# Patient Record
Sex: Male | Born: 1955
Health system: Southern US, Community
[De-identification: ages and names within clinical notes are randomized; demographics above are authoritative.]

## PROBLEM LIST (undated history)

## (undated) DIAGNOSIS — I10 Essential (primary) hypertension: Secondary | ICD-10-CM

## (undated) DIAGNOSIS — R972 Elevated prostate specific antigen [PSA]: Secondary | ICD-10-CM

## (undated) DIAGNOSIS — M1990 Unspecified osteoarthritis, unspecified site: Secondary | ICD-10-CM

## (undated) DIAGNOSIS — M5136 Other intervertebral disc degeneration, lumbar region: Secondary | ICD-10-CM

## (undated) DIAGNOSIS — T7840XA Allergy, unspecified, initial encounter: Secondary | ICD-10-CM

## (undated) DIAGNOSIS — M51369 Other intervertebral disc degeneration, lumbar region without mention of lumbar back pain or lower extremity pain: Secondary | ICD-10-CM

## (undated) DIAGNOSIS — N529 Male erectile dysfunction, unspecified: Secondary | ICD-10-CM

## (undated) DIAGNOSIS — H35039 Hypertensive retinopathy, unspecified eye: Secondary | ICD-10-CM

## (undated) DIAGNOSIS — E785 Hyperlipidemia, unspecified: Secondary | ICD-10-CM

## (undated) DIAGNOSIS — K649 Unspecified hemorrhoids: Secondary | ICD-10-CM

## (undated) DIAGNOSIS — J45909 Unspecified asthma, uncomplicated: Secondary | ICD-10-CM

## (undated) DIAGNOSIS — E7800 Pure hypercholesterolemia, unspecified: Secondary | ICD-10-CM

## (undated) DIAGNOSIS — J309 Allergic rhinitis, unspecified: Secondary | ICD-10-CM

## (undated) DIAGNOSIS — N400 Enlarged prostate without lower urinary tract symptoms: Secondary | ICD-10-CM

## (undated) HISTORY — DX: Elevated prostate specific antigen (PSA): R97.20

## (undated) HISTORY — DX: Hypertensive retinopathy, unspecified eye: H35.039

## (undated) HISTORY — DX: Hyperlipidemia, unspecified: E78.5

## (undated) HISTORY — DX: Other intervertebral disc degeneration, lumbar region without mention of lumbar back pain or lower extremity pain: M51.369

## (undated) HISTORY — DX: Benign prostatic hyperplasia without lower urinary tract symptoms: N40.0

## (undated) HISTORY — DX: Male erectile dysfunction, unspecified: N52.9

## (undated) HISTORY — DX: Allergic rhinitis, unspecified: J30.9

## (undated) HISTORY — DX: Unspecified asthma, uncomplicated: J45.909

## (undated) HISTORY — DX: Allergy, unspecified, initial encounter: T78.40XA

## (undated) HISTORY — DX: Unspecified osteoarthritis, unspecified site: M19.90

## (undated) HISTORY — DX: Pure hypercholesterolemia, unspecified: E78.00

## (undated) HISTORY — DX: Unspecified hemorrhoids: K64.9

## (undated) HISTORY — DX: Essential (primary) hypertension: I10

## (undated) HISTORY — DX: Other intervertebral disc degeneration, lumbar region: M51.36

## (undated) HISTORY — PX: ROTATOR CUFF REPAIR: SHX139

---

## 1997-08-02 ENCOUNTER — Ambulatory Visit (HOSPITAL_COMMUNITY): Admission: RE | Admit: 1997-08-02 | Discharge: 1997-08-02 | Payer: Self-pay | Admitting: Orthopedic Surgery

## 1997-08-30 ENCOUNTER — Encounter: Admission: RE | Admit: 1997-08-30 | Discharge: 1997-11-28 | Payer: Self-pay | Admitting: Orthopedic Surgery

## 2004-08-20 ENCOUNTER — Ambulatory Visit: Payer: Self-pay | Admitting: Family Medicine

## 2004-09-17 ENCOUNTER — Ambulatory Visit: Payer: Self-pay | Admitting: Gastroenterology

## 2004-10-08 ENCOUNTER — Ambulatory Visit: Payer: Self-pay | Admitting: Family Medicine

## 2004-11-01 ENCOUNTER — Ambulatory Visit: Payer: Self-pay | Admitting: Family Medicine

## 2005-03-26 ENCOUNTER — Ambulatory Visit: Payer: Self-pay | Admitting: Family Medicine

## 2005-08-05 ENCOUNTER — Ambulatory Visit: Payer: Self-pay | Admitting: Family Medicine

## 2005-08-19 ENCOUNTER — Ambulatory Visit: Payer: Self-pay | Admitting: Gastroenterology

## 2005-08-26 ENCOUNTER — Ambulatory Visit: Payer: Self-pay | Admitting: Family Medicine

## 2005-09-18 ENCOUNTER — Ambulatory Visit: Payer: Self-pay | Admitting: Gastroenterology

## 2005-10-14 ENCOUNTER — Ambulatory Visit: Payer: Self-pay | Admitting: Family Medicine

## 2005-12-27 ENCOUNTER — Ambulatory Visit: Payer: Self-pay | Admitting: Internal Medicine

## 2006-04-11 ENCOUNTER — Ambulatory Visit: Payer: Self-pay | Admitting: Family Medicine

## 2006-04-14 ENCOUNTER — Encounter: Admission: RE | Admit: 2006-04-14 | Discharge: 2006-04-14 | Payer: Self-pay | Admitting: Family Medicine

## 2006-05-17 ENCOUNTER — Encounter: Admission: RE | Admit: 2006-05-17 | Discharge: 2006-05-17 | Payer: Self-pay | Admitting: Orthopedic Surgery

## 2010-07-24 ENCOUNTER — Other Ambulatory Visit: Payer: Self-pay | Admitting: Orthopedic Surgery

## 2010-07-24 ENCOUNTER — Ambulatory Visit
Admission: RE | Admit: 2010-07-24 | Discharge: 2010-07-24 | Disposition: A | Discharge status: Home / Self Care | Payer: PRIVATE HEALTH INSURANCE | Source: Ambulatory Visit | Attending: Orthopedic Surgery | Admitting: Orthopedic Surgery

## 2010-07-24 DIAGNOSIS — M25522 Pain in left elbow: Secondary | ICD-10-CM

## 2010-07-24 DIAGNOSIS — M545 Low back pain: Secondary | ICD-10-CM

## 2010-07-24 DIAGNOSIS — M25559 Pain in unspecified hip: Secondary | ICD-10-CM

## 2012-07-02 ENCOUNTER — Other Ambulatory Visit: Payer: Self-pay | Admitting: Orthopedic Surgery

## 2012-07-02 ENCOUNTER — Ambulatory Visit
Admission: RE | Admit: 2012-07-02 | Discharge: 2012-07-02 | Disposition: A | Discharge status: Home / Self Care | Payer: PRIVATE HEALTH INSURANCE | Source: Ambulatory Visit | Attending: Orthopedic Surgery | Admitting: Orthopedic Surgery

## 2012-07-02 DIAGNOSIS — M545 Low back pain: Secondary | ICD-10-CM

## 2012-11-06 ENCOUNTER — Ambulatory Visit: Payer: Self-pay | Admitting: Physician Assistant

## 2012-11-06 VITALS — BP 120/88 | HR 48 | Temp 98.0°F | Resp 16 | Ht 73.5 in | Wt 236.0 lb

## 2012-11-06 DIAGNOSIS — Z0289 Encounter for other administrative examinations: Secondary | ICD-10-CM

## 2012-11-06 NOTE — Progress Notes (Signed)
  Subjective:    Patient ID: David Reed, male    DOB: [birth year], 57 y.o.   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    He does have a history of HTN that is well controlled on amlodipine, losartan, and nebivolol.  He denies any other medical history or medication.   He does have remote history of a rotator cuff surgeries, but does not have any sequela.  He does wear corrective lenses when driving.   Review of Systems  Constitutional: Negative.   HENT: Negative.   Respiratory: Negative.   Cardiovascular: Negative.   Gastrointestinal: Negative.   Musculoskeletal: Negative.   Skin: Negative.   Neurological: Negative.        Objective:   Physical Exam  Vitals reviewed. Constitutional: He is oriented to person, place, and time. He appears well-developed and well-nourished. No distress.  HENT:  Head: Normocephalic and atraumatic.  Right Ear: Tympanic membrane and ear canal normal.  Left Ear: Tympanic membrane and ear canal normal.  Nose: Nose normal.  Mouth/Throat: Uvula is midline, oropharynx is clear and moist and mucous membranes are normal.  Eyes: Conjunctivae and EOM are normal. Pupils are equal, round, and reactive to light. No scleral icterus.  Neck: Neck supple. No thyromegaly present.  Cardiovascular: Normal rate, regular rhythm, normal heart sounds and intact distal pulses.  Exam reveals no gallop and no friction rub.   No murmur heard. Pulmonary/Chest: Effort normal and breath sounds normal. He has no wheezes. He has no rales.  Abdominal: Soft. Bowel sounds are normal. There is no tenderness. Hernia confirmed negative in the right inguinal area and confirmed negative in the left inguinal area.  Musculoskeletal: Normal range of  motion.  Lymphadenopathy:    He has no cervical adenopathy.  Neurological: He is alert and oriented to person, place, and time. He has normal reflexes. No cranial nerve deficit.  Skin: Skin is warm and dry.  Psychiatric: He has a normal mood and affect. His behavior is normal.      Visual Acuity Screening   Right eye Left eye Both eyes  Without correction:     With correction: 20/15 20/15 20/15   Comments: The patient can distinguish the colors red, amber and green.  Peripheral Vision: Right eye *85** degrees. Left eye **85* degrees.  Hearing Screening Comments: The patient was able to hear a forced whisper from 10 feet.       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  History of well controlled HTN - BP today WNL.  Physical exam is WNL.  He does wear corrective lenses.  Ok to certify for 1 yr.  Card signed.

## 2012-11-12 ENCOUNTER — Encounter: Payer: Self-pay | Admitting: Physician Assistant

## 2013-02-18 ENCOUNTER — Other Ambulatory Visit: Payer: Self-pay | Admitting: Internal Medicine

## 2013-02-18 DIAGNOSIS — R109 Unspecified abdominal pain: Secondary | ICD-10-CM

## 2013-02-23 ENCOUNTER — Other Ambulatory Visit: Payer: PRIVATE HEALTH INSURANCE

## 2014-09-12 ENCOUNTER — Other Ambulatory Visit: Payer: Self-pay | Admitting: Orthopaedic Surgery

## 2014-09-12 DIAGNOSIS — M5136 Other intervertebral disc degeneration, lumbar region: Secondary | ICD-10-CM

## 2014-10-10 ENCOUNTER — Other Ambulatory Visit: Payer: PRIVATE HEALTH INSURANCE

## 2014-10-21 ENCOUNTER — Ambulatory Visit
Admission: RE | Admit: 2014-10-21 | Discharge: 2014-10-21 | Disposition: A | Discharge status: Home / Self Care | Payer: BC Managed Care – PPO | Source: Ambulatory Visit | Attending: Orthopaedic Surgery | Admitting: Orthopaedic Surgery

## 2014-10-21 DIAGNOSIS — M5136 Other intervertebral disc degeneration, lumbar region: Secondary | ICD-10-CM

## 2015-08-16 ENCOUNTER — Encounter: Payer: Self-pay | Admitting: Gastroenterology

## 2015-11-21 DIAGNOSIS — R972 Elevated prostate specific antigen [PSA]: Secondary | ICD-10-CM | POA: Insufficient documentation

## 2015-11-30 ENCOUNTER — Encounter: Payer: Self-pay | Admitting: Gastroenterology

## 2016-01-09 ENCOUNTER — Ambulatory Visit (AMBULATORY_SURGERY_CENTER): Payer: Self-pay

## 2016-01-09 ENCOUNTER — Encounter (INDEPENDENT_AMBULATORY_CARE_PROVIDER_SITE_OTHER): Payer: Self-pay

## 2016-01-09 VITALS — Ht 74.5 in | Wt 246.0 lb

## 2016-01-09 DIAGNOSIS — Z1211 Encounter for screening for malignant neoplasm of colon: Secondary | ICD-10-CM

## 2016-01-09 MED ORDER — NA SULFATE-K SULFATE-MG SULF 17.5-3.13-1.6 GM/177ML PO SOLN: ORAL | 0 refills | Status: DC

## 2016-01-09 NOTE — Progress Notes (Signed)
Per pt, no allergies to soy or egg products.Pt not taking any weight loss meds or using  O2 at home. 

## 2016-01-16 ENCOUNTER — Encounter: Payer: Self-pay | Admitting: Gastroenterology

## 2016-01-23 ENCOUNTER — Encounter: Payer: BC Managed Care – PPO | Admitting: Gastroenterology

## 2018-12-30 DIAGNOSIS — R69 Illness, unspecified: Secondary | ICD-10-CM | POA: Diagnosis not present

## 2018-12-31 DIAGNOSIS — M47816 Spondylosis without myelopathy or radiculopathy, lumbar region: Secondary | ICD-10-CM | POA: Diagnosis not present

## 2019-01-05 DIAGNOSIS — M5136 Other intervertebral disc degeneration, lumbar region: Secondary | ICD-10-CM | POA: Diagnosis not present

## 2019-01-05 DIAGNOSIS — M62830 Muscle spasm of back: Secondary | ICD-10-CM | POA: Diagnosis not present

## 2019-01-05 DIAGNOSIS — M25651 Stiffness of right hip, not elsewhere classified: Secondary | ICD-10-CM | POA: Diagnosis not present

## 2019-01-05 DIAGNOSIS — M9902 Segmental and somatic dysfunction of thoracic region: Secondary | ICD-10-CM | POA: Diagnosis not present

## 2019-01-05 DIAGNOSIS — M25652 Stiffness of left hip, not elsewhere classified: Secondary | ICD-10-CM | POA: Diagnosis not present

## 2019-01-05 DIAGNOSIS — M9904 Segmental and somatic dysfunction of sacral region: Secondary | ICD-10-CM | POA: Diagnosis not present

## 2019-01-07 DIAGNOSIS — M62830 Muscle spasm of back: Secondary | ICD-10-CM | POA: Diagnosis not present

## 2019-01-07 DIAGNOSIS — M5136 Other intervertebral disc degeneration, lumbar region: Secondary | ICD-10-CM | POA: Diagnosis not present

## 2019-01-07 DIAGNOSIS — M25651 Stiffness of right hip, not elsewhere classified: Secondary | ICD-10-CM | POA: Diagnosis not present

## 2019-01-07 DIAGNOSIS — M25652 Stiffness of left hip, not elsewhere classified: Secondary | ICD-10-CM | POA: Diagnosis not present

## 2019-01-07 DIAGNOSIS — M9902 Segmental and somatic dysfunction of thoracic region: Secondary | ICD-10-CM | POA: Diagnosis not present

## 2019-01-07 DIAGNOSIS — M9904 Segmental and somatic dysfunction of sacral region: Secondary | ICD-10-CM | POA: Diagnosis not present

## 2019-01-12 DIAGNOSIS — M62830 Muscle spasm of back: Secondary | ICD-10-CM | POA: Diagnosis not present

## 2019-01-12 DIAGNOSIS — M9904 Segmental and somatic dysfunction of sacral region: Secondary | ICD-10-CM | POA: Diagnosis not present

## 2019-01-12 DIAGNOSIS — M5136 Other intervertebral disc degeneration, lumbar region: Secondary | ICD-10-CM | POA: Diagnosis not present

## 2019-01-12 DIAGNOSIS — M25651 Stiffness of right hip, not elsewhere classified: Secondary | ICD-10-CM | POA: Diagnosis not present

## 2019-01-12 DIAGNOSIS — M9902 Segmental and somatic dysfunction of thoracic region: Secondary | ICD-10-CM | POA: Diagnosis not present

## 2019-01-12 DIAGNOSIS — M25652 Stiffness of left hip, not elsewhere classified: Secondary | ICD-10-CM | POA: Diagnosis not present

## 2019-01-14 DIAGNOSIS — M5136 Other intervertebral disc degeneration, lumbar region: Secondary | ICD-10-CM | POA: Diagnosis not present

## 2019-01-14 DIAGNOSIS — M9902 Segmental and somatic dysfunction of thoracic region: Secondary | ICD-10-CM | POA: Diagnosis not present

## 2019-01-14 DIAGNOSIS — M25652 Stiffness of left hip, not elsewhere classified: Secondary | ICD-10-CM | POA: Diagnosis not present

## 2019-01-14 DIAGNOSIS — M9904 Segmental and somatic dysfunction of sacral region: Secondary | ICD-10-CM | POA: Diagnosis not present

## 2019-01-14 DIAGNOSIS — M25651 Stiffness of right hip, not elsewhere classified: Secondary | ICD-10-CM | POA: Diagnosis not present

## 2019-01-14 DIAGNOSIS — M62830 Muscle spasm of back: Secondary | ICD-10-CM | POA: Diagnosis not present

## 2019-01-19 DIAGNOSIS — M25652 Stiffness of left hip, not elsewhere classified: Secondary | ICD-10-CM | POA: Diagnosis not present

## 2019-01-19 DIAGNOSIS — M9902 Segmental and somatic dysfunction of thoracic region: Secondary | ICD-10-CM | POA: Diagnosis not present

## 2019-01-19 DIAGNOSIS — M25651 Stiffness of right hip, not elsewhere classified: Secondary | ICD-10-CM | POA: Diagnosis not present

## 2019-01-19 DIAGNOSIS — M5136 Other intervertebral disc degeneration, lumbar region: Secondary | ICD-10-CM | POA: Diagnosis not present

## 2019-01-19 DIAGNOSIS — M9904 Segmental and somatic dysfunction of sacral region: Secondary | ICD-10-CM | POA: Diagnosis not present

## 2019-01-19 DIAGNOSIS — M62830 Muscle spasm of back: Secondary | ICD-10-CM | POA: Diagnosis not present

## 2019-01-21 DIAGNOSIS — M5136 Other intervertebral disc degeneration, lumbar region: Secondary | ICD-10-CM | POA: Diagnosis not present

## 2019-01-21 DIAGNOSIS — M25652 Stiffness of left hip, not elsewhere classified: Secondary | ICD-10-CM | POA: Diagnosis not present

## 2019-01-21 DIAGNOSIS — M9902 Segmental and somatic dysfunction of thoracic region: Secondary | ICD-10-CM | POA: Diagnosis not present

## 2019-01-21 DIAGNOSIS — M9904 Segmental and somatic dysfunction of sacral region: Secondary | ICD-10-CM | POA: Diagnosis not present

## 2019-01-21 DIAGNOSIS — M62830 Muscle spasm of back: Secondary | ICD-10-CM | POA: Diagnosis not present

## 2019-01-21 DIAGNOSIS — M25651 Stiffness of right hip, not elsewhere classified: Secondary | ICD-10-CM | POA: Diagnosis not present

## 2019-01-26 DIAGNOSIS — M25652 Stiffness of left hip, not elsewhere classified: Secondary | ICD-10-CM | POA: Diagnosis not present

## 2019-01-26 DIAGNOSIS — M5136 Other intervertebral disc degeneration, lumbar region: Secondary | ICD-10-CM | POA: Diagnosis not present

## 2019-01-26 DIAGNOSIS — M62830 Muscle spasm of back: Secondary | ICD-10-CM | POA: Diagnosis not present

## 2019-01-26 DIAGNOSIS — M9904 Segmental and somatic dysfunction of sacral region: Secondary | ICD-10-CM | POA: Diagnosis not present

## 2019-01-26 DIAGNOSIS — M25651 Stiffness of right hip, not elsewhere classified: Secondary | ICD-10-CM | POA: Diagnosis not present

## 2019-01-26 DIAGNOSIS — M9902 Segmental and somatic dysfunction of thoracic region: Secondary | ICD-10-CM | POA: Diagnosis not present

## 2019-01-28 DIAGNOSIS — M5136 Other intervertebral disc degeneration, lumbar region: Secondary | ICD-10-CM | POA: Diagnosis not present

## 2019-01-28 DIAGNOSIS — M62830 Muscle spasm of back: Secondary | ICD-10-CM | POA: Diagnosis not present

## 2019-01-28 DIAGNOSIS — M25651 Stiffness of right hip, not elsewhere classified: Secondary | ICD-10-CM | POA: Diagnosis not present

## 2019-01-28 DIAGNOSIS — M9904 Segmental and somatic dysfunction of sacral region: Secondary | ICD-10-CM | POA: Diagnosis not present

## 2019-01-28 DIAGNOSIS — M9902 Segmental and somatic dysfunction of thoracic region: Secondary | ICD-10-CM | POA: Diagnosis not present

## 2019-01-28 DIAGNOSIS — M25652 Stiffness of left hip, not elsewhere classified: Secondary | ICD-10-CM | POA: Diagnosis not present

## 2019-02-02 DIAGNOSIS — M25651 Stiffness of right hip, not elsewhere classified: Secondary | ICD-10-CM | POA: Diagnosis not present

## 2019-02-02 DIAGNOSIS — M5136 Other intervertebral disc degeneration, lumbar region: Secondary | ICD-10-CM | POA: Diagnosis not present

## 2019-02-02 DIAGNOSIS — M9902 Segmental and somatic dysfunction of thoracic region: Secondary | ICD-10-CM | POA: Diagnosis not present

## 2019-02-02 DIAGNOSIS — M25652 Stiffness of left hip, not elsewhere classified: Secondary | ICD-10-CM | POA: Diagnosis not present

## 2019-02-02 DIAGNOSIS — M9904 Segmental and somatic dysfunction of sacral region: Secondary | ICD-10-CM | POA: Diagnosis not present

## 2019-02-02 DIAGNOSIS — M62830 Muscle spasm of back: Secondary | ICD-10-CM | POA: Diagnosis not present

## 2019-02-09 DIAGNOSIS — M25652 Stiffness of left hip, not elsewhere classified: Secondary | ICD-10-CM | POA: Diagnosis not present

## 2019-02-09 DIAGNOSIS — M62830 Muscle spasm of back: Secondary | ICD-10-CM | POA: Diagnosis not present

## 2019-02-09 DIAGNOSIS — M5136 Other intervertebral disc degeneration, lumbar region: Secondary | ICD-10-CM | POA: Diagnosis not present

## 2019-02-09 DIAGNOSIS — M25651 Stiffness of right hip, not elsewhere classified: Secondary | ICD-10-CM | POA: Diagnosis not present

## 2019-02-09 DIAGNOSIS — M9902 Segmental and somatic dysfunction of thoracic region: Secondary | ICD-10-CM | POA: Diagnosis not present

## 2019-02-09 DIAGNOSIS — M9904 Segmental and somatic dysfunction of sacral region: Secondary | ICD-10-CM | POA: Diagnosis not present

## 2019-02-11 DIAGNOSIS — M25651 Stiffness of right hip, not elsewhere classified: Secondary | ICD-10-CM | POA: Diagnosis not present

## 2019-02-11 DIAGNOSIS — M62830 Muscle spasm of back: Secondary | ICD-10-CM | POA: Diagnosis not present

## 2019-02-11 DIAGNOSIS — M9904 Segmental and somatic dysfunction of sacral region: Secondary | ICD-10-CM | POA: Diagnosis not present

## 2019-02-11 DIAGNOSIS — M5136 Other intervertebral disc degeneration, lumbar region: Secondary | ICD-10-CM | POA: Diagnosis not present

## 2019-02-11 DIAGNOSIS — M25652 Stiffness of left hip, not elsewhere classified: Secondary | ICD-10-CM | POA: Diagnosis not present

## 2019-02-11 DIAGNOSIS — M9902 Segmental and somatic dysfunction of thoracic region: Secondary | ICD-10-CM | POA: Diagnosis not present

## 2019-02-18 DIAGNOSIS — M9902 Segmental and somatic dysfunction of thoracic region: Secondary | ICD-10-CM | POA: Diagnosis not present

## 2019-02-18 DIAGNOSIS — M25651 Stiffness of right hip, not elsewhere classified: Secondary | ICD-10-CM | POA: Diagnosis not present

## 2019-02-18 DIAGNOSIS — M25652 Stiffness of left hip, not elsewhere classified: Secondary | ICD-10-CM | POA: Diagnosis not present

## 2019-02-18 DIAGNOSIS — M9904 Segmental and somatic dysfunction of sacral region: Secondary | ICD-10-CM | POA: Diagnosis not present

## 2019-02-18 DIAGNOSIS — M62830 Muscle spasm of back: Secondary | ICD-10-CM | POA: Diagnosis not present

## 2019-02-18 DIAGNOSIS — M5136 Other intervertebral disc degeneration, lumbar region: Secondary | ICD-10-CM | POA: Diagnosis not present

## 2019-02-22 DIAGNOSIS — M160 Bilateral primary osteoarthritis of hip: Secondary | ICD-10-CM | POA: Diagnosis not present

## 2019-02-22 DIAGNOSIS — M545 Low back pain: Secondary | ICD-10-CM | POA: Diagnosis not present

## 2019-02-25 DIAGNOSIS — M5136 Other intervertebral disc degeneration, lumbar region: Secondary | ICD-10-CM | POA: Diagnosis not present

## 2019-02-25 DIAGNOSIS — M62830 Muscle spasm of back: Secondary | ICD-10-CM | POA: Diagnosis not present

## 2019-02-25 DIAGNOSIS — M9904 Segmental and somatic dysfunction of sacral region: Secondary | ICD-10-CM | POA: Diagnosis not present

## 2019-02-25 DIAGNOSIS — M25652 Stiffness of left hip, not elsewhere classified: Secondary | ICD-10-CM | POA: Diagnosis not present

## 2019-02-25 DIAGNOSIS — M25651 Stiffness of right hip, not elsewhere classified: Secondary | ICD-10-CM | POA: Diagnosis not present

## 2019-02-25 DIAGNOSIS — M9902 Segmental and somatic dysfunction of thoracic region: Secondary | ICD-10-CM | POA: Diagnosis not present

## 2019-03-04 DIAGNOSIS — M545 Low back pain: Secondary | ICD-10-CM | POA: Diagnosis not present

## 2019-03-08 DIAGNOSIS — M160 Bilateral primary osteoarthritis of hip: Secondary | ICD-10-CM | POA: Diagnosis not present

## 2019-03-08 DIAGNOSIS — M545 Low back pain: Secondary | ICD-10-CM | POA: Diagnosis not present

## 2019-03-11 DIAGNOSIS — M9902 Segmental and somatic dysfunction of thoracic region: Secondary | ICD-10-CM | POA: Diagnosis not present

## 2019-03-11 DIAGNOSIS — M62830 Muscle spasm of back: Secondary | ICD-10-CM | POA: Diagnosis not present

## 2019-03-11 DIAGNOSIS — M25651 Stiffness of right hip, not elsewhere classified: Secondary | ICD-10-CM | POA: Diagnosis not present

## 2019-03-11 DIAGNOSIS — M25652 Stiffness of left hip, not elsewhere classified: Secondary | ICD-10-CM | POA: Diagnosis not present

## 2019-03-11 DIAGNOSIS — M9904 Segmental and somatic dysfunction of sacral region: Secondary | ICD-10-CM | POA: Diagnosis not present

## 2019-03-11 DIAGNOSIS — M5136 Other intervertebral disc degeneration, lumbar region: Secondary | ICD-10-CM | POA: Diagnosis not present

## 2019-03-12 DIAGNOSIS — E7800 Pure hypercholesterolemia, unspecified: Secondary | ICD-10-CM | POA: Diagnosis not present

## 2019-03-12 DIAGNOSIS — I10 Essential (primary) hypertension: Secondary | ICD-10-CM | POA: Diagnosis not present

## 2019-03-12 DIAGNOSIS — M160 Bilateral primary osteoarthritis of hip: Secondary | ICD-10-CM | POA: Diagnosis not present

## 2019-03-18 DIAGNOSIS — G8929 Other chronic pain: Secondary | ICD-10-CM | POA: Diagnosis not present

## 2019-03-18 DIAGNOSIS — M549 Dorsalgia, unspecified: Secondary | ICD-10-CM | POA: Diagnosis not present

## 2019-03-18 DIAGNOSIS — M545 Low back pain: Secondary | ICD-10-CM | POA: Diagnosis not present

## 2019-03-18 DIAGNOSIS — M5137 Other intervertebral disc degeneration, lumbosacral region: Secondary | ICD-10-CM | POA: Diagnosis not present

## 2019-03-18 DIAGNOSIS — M5136 Other intervertebral disc degeneration, lumbar region: Secondary | ICD-10-CM | POA: Diagnosis not present

## 2019-03-18 DIAGNOSIS — M47816 Spondylosis without myelopathy or radiculopathy, lumbar region: Secondary | ICD-10-CM | POA: Diagnosis not present

## 2019-04-06 ENCOUNTER — Other Ambulatory Visit: Payer: Self-pay | Admitting: Orthopedic Surgery

## 2019-04-06 DIAGNOSIS — M1612 Unilateral primary osteoarthritis, left hip: Secondary | ICD-10-CM | POA: Diagnosis present

## 2019-04-06 NOTE — Patient Instructions (Addendum)
DUE TO COVID-19 ONLY ONE VISITOR IS ALLOWED TO COME WITH YOU AND STAY IN THE WAITING ROOM ONLY DURING PRE OP AND PROCEDURE DAY OF SURGERY. THE 1 VISITOR MAY VISIT WITH YOU AFTER SURGERY IN YOUR PRIVATE ROOM DURING VISITING HOURS ONLY!  YOU NEED TO HAVE A COVID 19 TEST ON: 04/09/2019 at 8:30 AM. THIS TEST MUST BE DONE BEFORE SURGERY, COME  David Reed, David Reed , [id number].  (David Reed) ONCE YOUR COVID TEST IS COMPLETED, PLEASE BEGIN THE QUARANTINE INSTRUCTIONS AS OUTLINED IN YOUR HANDOUT.                David Reed    Your procedure is scheduled on: 04/13/2019   Report to [facility] Main  Entrance   Report to admitting at 10:25 AM     Call this number if you have problems the morning of surgery [phone]    Remember: NO SOLID FOOD AFTER MIDNIGHT THE NIGHT PRIOR TO SURGERY. YOU MAY DRINK CLEAR LIQUIDS.   STOP CLEAR LIQUIDS AT ___9:55am______ AND THEN DRINK THE ENSURE PRE-SURGERY David Reed. NOTHING BY MOUTH AFTER THE ENSURE DRINK!       CLEAR LIQUID DIET   Foods Allowed                                                                     Foods Excluded  Coffee and tea, regular and decaf                             liquids that you cannot  Plain Jell-O any favor except red or purple                                           see through such as: Fruit ices (not with fruit pulp)                                     milk, soups, orange juice  Iced Popsicles                                    All solid food Carbonated beverages, regular and diet                                    Cranberry, grape and apple juices Sports drinks like Gatorade Lightly seasoned clear broth or consume(fat free) Sugar, honey syrup  Sample Menu Breakfast                                Lunch                                     Supper Cranberry juice  Beef broth                            Chicken broth Jell-O                                     Grape juice                            Apple juice Coffee or tea                        Jell-O                                      Popsicle                                                Coffee or tea                        Coffee or tea  _____________________________________________________________________    BRUSH YOUR TEETH MORNING OF SURGERY AND RINSE YOUR MOUTH OUT, NO CHEWING GUM CANDY OR MINTS.     Take these medicines the morning of surgery with A SIP OF WATER: Zyrtec, Inhalers as needed.                                You may not have any metal on your body including hair pins and              piercings  Do not wear jewelry, lotions, powders or perfumes, deodorant                          Men may shave face and neck.   Do not bring valuables to the hospital. David Reed.  Contacts, dentures or bridgework may not be worn into surgery.  Leave suitcase in the car. After surgery it may be brought to your room.     Patients discharged the day of surgery will not be allowed to drive home. IF YOU ARE HAVING SURGERY AND GOING HOME THE SAME DAY, YOU MUST HAVE AN ADULT TO DRIVE YOU HOME AND  BE WITH YOU FOR 24 HOURS. YOU MAY GO HOME BY TAXI OR UBER OR ORTHERWISE, BUT AN ADULT MUST ACCOMPANY YOU HOME AND STAY WITH YOU FOR 24 HOURS.  Name and phone number of your driver:  Special Instructions: N/A              Please read over the following fact sheets you were given: _____________________________________________________________________             [facility] - Preparing for Surgery Before surgery, you can play an important role.  Because skin is not sterile, your skin needs to be as free of germs as possible.  You can reduce the number of germs on your skin by washing with CHG (  chlorahexidine gluconate) soap before surgery.  CHG is an antiseptic cleaner which kills germs and bonds with the skin to continue killing germs even after washing. Please DO  NOT use if you have an allergy to CHG or antibacterial soaps.  If your skin becomes reddened/irritated stop using the CHG and inform your nurse when you arrive at Short Stay. Do not shave (including legs and underarms) for at least 48 hours prior to the first CHG shower.  You may shave your face/neck. Please follow these instructions carefully:  1.  Shower with CHG Soap the night before surgery and the  morning of Surgery.  2.  If you choose to wash your hair, wash your hair first as usual with your  normal  shampoo.  3.  After you shampoo, rinse your hair and body thoroughly to remove the  shampoo.                           4.  Use CHG as you would any other liquid soap.  You can apply chg directly  to the skin and wash                       Gently with a scrungie or clean washcloth.  5.  Apply the CHG Soap to your body ONLY FROM THE NECK DOWN.   Do not use on face/ open                           Wound or open sores. Avoid contact with eyes, ears mouth and genitals (private parts).                       Wash face,  Genitals (private parts) with your normal soap.             6.  Wash thoroughly, paying special attention to the area where your surgery  will be performed.  7.  Thoroughly rinse your body with warm water from the neck down.  8.  DO NOT shower/wash with your normal soap after using and rinsing off  the CHG Soap.                9.  Pat yourself dry with a clean towel.            10.  Wear clean pajamas.            11.  Place clean sheets on your bed the night of your first shower and do not  sleep with pets. Day of Surgery : Do not apply any lotions/deodorants the morning of surgery.  Please wear clean clothes to the hospital/surgery center.  FAILURE TO FOLLOW THESE INSTRUCTIONS MAY RESULT IN THE CANCELLATION OF YOUR SURGERY PATIENT SIGNATURE_________________________________  NURSE  SIGNATURE__________________________________  ________________________________________________________________________     David Reed  An incentive spirometer is a tool that can help keep your lungs clear and active. This tool measures how well you are filling your lungs with each breath. Taking long deep breaths may help reverse or decrease the chance of developing breathing (pulmonary) problems (especially infection) following:  A long period of time when you are unable to move or be active. BEFORE THE PROCEDURE   If the spirometer includes an indicator to show your best effort, your nurse or respiratory therapist will set it to a desired goal.  If possible, sit up straight  or lean slightly forward. Try not to slouch.  Hold the incentive spirometer in an upright position. INSTRUCTIONS FOR USE  1. Sit on the edge of your bed if possible, or sit up as far as you can in bed or on a chair. 2. Hold the incentive spirometer in an upright position. 3. Breathe out normally. 4. Place the mouthpiece in your mouth and seal your lips tightly around it. 5. Breathe in slowly and as deeply as possible, raising the piston or the ball toward the top of the column. 6. Hold your breath for 3-5 seconds or for as long as possible. Allow the piston or ball to fall to the bottom of the column. 7. Remove the mouthpiece from your mouth and breathe out normally. 8. Rest for a few seconds and repeat Steps 1 through 7 at least 10 times every 1-2 hours when you are awake. Take your time and take a few normal breaths between deep breaths. 9. The spirometer may include an indicator to show your best effort. Use the indicator as a goal to work toward during each repetition. 10. After each set of 10 deep breaths, practice coughing to be sure your lungs are clear. If you have an incision (the cut made at the time of surgery), support your incision when coughing by placing a pillow or rolled up towels firmly  against it. Once you are able to get out of bed, walk around indoors and cough well. You may stop using the incentive spirometer when instructed by your caregiver.  RISKS AND COMPLICATIONS  Take your time so you do not get dizzy or light-headed.  If you are in pain, you may need to take or ask for pain medication before doing incentive spirometry. It is harder to take a deep breath if you are having pain. AFTER USE  Rest and breathe slowly and easily.  It can be helpful to keep track of a log of your progress. Your caregiver can provide you with a simple table to help with this. If you are using the spirometer at home, follow these instructions: [place] IF:   You are having difficultly using the spirometer.  You have trouble using the spirometer as often as instructed.  Your pain medication is not giving enough relief while using the spirometer.  You develop fever of 100.5 F (38.1 C) or higher. SEEK IMMEDIATE MEDICAL CARE IF:   You cough up bloody sputum that had not been present before.  You develop fever of 102 F (38.9 C) or greater.  You develop worsening pain at or near the incision site. MAKE SURE YOU:   Understand these instructions.  Will watch your condition.  Will get help right away if you are not doing well or get worse. Document Released: 08/26/2006 Document Revised: 07/08/2011 Document Reviewed: 10/27/2006 [facility] Patient Information 2014 [place], [place].   ________________________________________________________________________

## 2019-04-06 NOTE — Progress Notes (Signed)
Can you please write the surgical orders,pt. Has his PAT appoinment tomorrow.Thanks.

## 2019-04-07 ENCOUNTER — Other Ambulatory Visit: Payer: Self-pay

## 2019-04-07 ENCOUNTER — Encounter (HOSPITAL_COMMUNITY): Payer: Self-pay

## 2019-04-07 ENCOUNTER — Encounter (HOSPITAL_COMMUNITY)
Admission: RE | Admit: 2019-04-07 | Discharge: 2019-04-07 | Disposition: A | Discharge status: Home / Self Care | Payer: Medicare HMO | Source: Ambulatory Visit | Attending: Orthopedic Surgery | Admitting: Orthopedic Surgery

## 2019-04-07 DIAGNOSIS — M25552 Pain in left hip: Secondary | ICD-10-CM | POA: Diagnosis not present

## 2019-04-07 DIAGNOSIS — Z01818 Encounter for other preprocedural examination: Secondary | ICD-10-CM | POA: Diagnosis not present

## 2019-04-07 DIAGNOSIS — R001 Bradycardia, unspecified: Secondary | ICD-10-CM | POA: Insufficient documentation

## 2019-04-07 LAB — CBC
HCT: 47.1 % (ref 39.0–52.0)
Hemoglobin: 14.8 g/dL (ref 13.0–17.0)
MCH: 29.6 pg (ref 26.0–34.0)
MCHC: 31.4 g/dL (ref 30.0–36.0)
MCV: 94.2 fL (ref 80.0–100.0)
Platelets: 264 10*3/uL (ref 150–400)
RBC: 5 MIL/uL (ref 4.22–5.81)
RDW: 14.4 % (ref 11.5–15.5)
WBC: 3.4 10*3/uL — ABNORMAL LOW (ref 4.0–10.5)
nRBC: 0 % (ref 0.0–0.2)

## 2019-04-07 LAB — SURGICAL PCR SCREEN
MRSA, PCR: NEGATIVE
Staphylococcus aureus: NEGATIVE

## 2019-04-07 LAB — BASIC METABOLIC PANEL
Anion gap: 5 (ref 5–15)
BUN: 17 mg/dL (ref 8–23)
CO2: 30 mmol/L (ref 22–32)
Calcium: 9.4 mg/dL (ref 8.9–10.3)
Chloride: 107 mmol/L (ref 98–111)
Creatinine, Ser: 0.96 mg/dL (ref 0.61–1.24)
GFR calc Af Amer: >60 mL/min (ref >60)
GFR calc non Af Amer: >60 mL/min (ref >60)
Glucose, Bld: 100 mg/dL — ABNORMAL HIGH (ref 70–99)
Potassium: 4.9 mmol/L (ref 3.5–5.1)
Sodium: 142 mmol/L (ref 135–145)

## 2019-04-07 NOTE — Progress Notes (Addendum)
PCP - Seward Carol Cardiologist -   Chest x-ray -  EKG -  Stress Test -  ECHO -  Cardiac Cath -   Sleep Study -  CPAP -   Fasting Blood Sugar -  Checks Blood Sugar _____ times a day  Blood Thinner Instructions: Aspirin Instructions: Last Dose:  Anesthesia review: Please review EKG,sinus Loletha Grayer.Asymptomatic.  Patient denies shortness of breath, fever, cough and chest pain at PAT appointment   Patient verbalized understanding of instructions that were given to them at the PAT appointment. Patient was also instructed that they will need to review over the PAT instructions again at home before surgery.

## 2019-04-09 ENCOUNTER — Other Ambulatory Visit (HOSPITAL_COMMUNITY)
Admission: RE | Admit: 2019-04-09 | Discharge: 2019-04-09 | Disposition: A | Discharge status: Home / Self Care | Payer: Medicare HMO | Source: Ambulatory Visit | Attending: Orthopedic Surgery | Admitting: Orthopedic Surgery

## 2019-04-09 DIAGNOSIS — Z01812 Encounter for preprocedural laboratory examination: Secondary | ICD-10-CM | POA: Diagnosis not present

## 2019-04-09 DIAGNOSIS — Z20828 Contact with and (suspected) exposure to other viral communicable diseases: Secondary | ICD-10-CM | POA: Diagnosis not present

## 2019-04-10 LAB — NOVEL CORONAVIRUS, NAA (HOSP ORDER, SEND-OUT TO REF LAB; TAT 18-24 HRS): SARS-CoV-2, NAA: NOT DETECTED

## 2019-04-13 ENCOUNTER — Inpatient Hospital Stay (HOSPITAL_COMMUNITY)
Admission: RE | Admit: 2019-04-13 | Discharge: 2019-04-14 | DRG: 470 | Disposition: A | Discharge status: Home Health | Payer: Medicare HMO | Attending: Orthopedic Surgery | Admitting: Orthopedic Surgery

## 2019-04-13 ENCOUNTER — Inpatient Hospital Stay (HOSPITAL_COMMUNITY): Payer: Medicare HMO

## 2019-04-13 ENCOUNTER — Encounter (HOSPITAL_COMMUNITY): Payer: Self-pay | Admitting: Orthopedic Surgery

## 2019-04-13 ENCOUNTER — Other Ambulatory Visit: Payer: Self-pay

## 2019-04-13 ENCOUNTER — Inpatient Hospital Stay (HOSPITAL_COMMUNITY): Payer: Medicare HMO | Admitting: Physician Assistant

## 2019-04-13 ENCOUNTER — Inpatient Hospital Stay (HOSPITAL_COMMUNITY): Payer: Medicare HMO | Admitting: Certified Registered Nurse Anesthetist

## 2019-04-13 ENCOUNTER — Encounter (HOSPITAL_COMMUNITY)
Admission: RE | Disposition: A | Discharge status: Home Health | Payer: Self-pay | Source: Home / Self Care | Attending: Orthopedic Surgery

## 2019-04-13 DIAGNOSIS — Z20828 Contact with and (suspected) exposure to other viral communicable diseases: Secondary | ICD-10-CM | POA: Diagnosis present

## 2019-04-13 DIAGNOSIS — I10 Essential (primary) hypertension: Secondary | ICD-10-CM | POA: Diagnosis not present

## 2019-04-13 DIAGNOSIS — M1612 Unilateral primary osteoarthritis, left hip: Principal | ICD-10-CM | POA: Diagnosis present

## 2019-04-13 DIAGNOSIS — Z96642 Presence of left artificial hip joint: Secondary | ICD-10-CM

## 2019-04-13 DIAGNOSIS — Z79899 Other long term (current) drug therapy: Secondary | ICD-10-CM | POA: Diagnosis not present

## 2019-04-13 DIAGNOSIS — Z811 Family history of alcohol abuse and dependence: Secondary | ICD-10-CM | POA: Diagnosis not present

## 2019-04-13 DIAGNOSIS — Z791 Long term (current) use of non-steroidal anti-inflammatories (NSAID): Secondary | ICD-10-CM | POA: Diagnosis not present

## 2019-04-13 DIAGNOSIS — Z471 Aftercare following joint replacement surgery: Secondary | ICD-10-CM | POA: Diagnosis not present

## 2019-04-13 DIAGNOSIS — R69 Illness, unspecified: Secondary | ICD-10-CM | POA: Diagnosis not present

## 2019-04-13 HISTORY — PX: TOTAL HIP ARTHROPLASTY: SHX124

## 2019-04-13 LAB — ABO/RH: ABO/RH(D): A POS

## 2019-04-13 LAB — TYPE AND SCREEN
ABO/RH(D): A POS
Antibody Screen: NEGATIVE

## 2019-04-13 SURGERY — ARTHROPLASTY, HIP, TOTAL,POSTERIOR APPROACH
Anesthesia: Spinal | Site: Hip | Laterality: Left

## 2019-04-13 MED ORDER — ACETAMINOPHEN 325 MG PO TABS: 325.0000 mg | ORAL_TABLET | Freq: Four times a day (QID) | ORAL | Status: DC | PRN

## 2019-04-13 MED ORDER — LACTATED RINGERS IV SOLN: INTRAVENOUS | Status: DC

## 2019-04-13 MED ORDER — BUPIVACAINE IN DEXTROSE 0.75-8.25 % IT SOLN
INTRATHECAL | Status: DC | PRN
  Administered 2019-04-13: 2 mL via INTRATHECAL

## 2019-04-13 MED ORDER — KETOROLAC TROMETHAMINE 15 MG/ML IJ SOLN
7.5000 mg | Freq: Four times a day (QID) | INTRAMUSCULAR | Status: DC
  Administered 2019-04-13 – 2019-04-14 (×3): 7.5 mg via INTRAVENOUS
  Filled 2019-04-13 (×3): qty 1

## 2019-04-13 MED ORDER — DEXAMETHASONE SODIUM PHOSPHATE 10 MG/ML IJ SOLN
INTRAMUSCULAR | Status: DC | PRN
  Administered 2019-04-13: 10 mg via INTRAVENOUS

## 2019-04-13 MED ORDER — PROPOFOL 10 MG/ML IV BOLUS
INTRAVENOUS | Status: DC | PRN
  Administered 2019-04-13: 30 mg via INTRAVENOUS

## 2019-04-13 MED ORDER — EPHEDRINE 5 MG/ML INJ
INTRAVENOUS | Status: AC
  Filled 2019-04-13: qty 20

## 2019-04-13 MED ORDER — BUPIVACAINE HCL (PF) 0.25 % IJ SOLN
INTRAMUSCULAR | Status: DC | PRN
  Administered 2019-04-13: 30 mL

## 2019-04-13 MED ORDER — CEFAZOLIN SODIUM-DEXTROSE 2-4 GM/100ML-% IV SOLN
2.0000 g | INTRAVENOUS | Status: AC
  Administered 2019-04-13: 14:00:00 2 g via INTRAVENOUS
  Filled 2019-04-13: qty 100

## 2019-04-13 MED ORDER — PROPOFOL 500 MG/50ML IV EMUL
INTRAVENOUS | Status: AC
  Filled 2019-04-13: qty 50

## 2019-04-13 MED ORDER — EPHEDRINE SULFATE-NACL 50-0.9 MG/10ML-% IV SOSY
PREFILLED_SYRINGE | INTRAVENOUS | Status: DC | PRN
  Administered 2019-04-13 (×5): 10 mg via INTRAVENOUS

## 2019-04-13 MED ORDER — METOCLOPRAMIDE HCL 5 MG PO TABS: 5.0000 mg | ORAL_TABLET | Freq: Three times a day (TID) | ORAL | Status: DC | PRN

## 2019-04-13 MED ORDER — ONDANSETRON HCL 4 MG/2ML IJ SOLN
INTRAMUSCULAR | Status: DC | PRN
  Administered 2019-04-13: 4 mg via INTRAVENOUS

## 2019-04-13 MED ORDER — ALUM & MAG HYDROXIDE-SIMETH 200-200-20 MG/5ML PO SUSP: 30.0000 mL | ORAL | Status: DC | PRN

## 2019-04-13 MED ORDER — POTASSIUM CHLORIDE IN NACL 20-0.45 MEQ/L-% IV SOLN
INTRAVENOUS | Status: DC
  Filled 2019-04-13 (×2): qty 1000

## 2019-04-13 MED ORDER — METHOCARBAMOL 500 MG PO TABS: 500.0000 mg | ORAL_TABLET | Freq: Four times a day (QID) | ORAL | Status: DC | PRN

## 2019-04-13 MED ORDER — PROPOFOL 500 MG/50ML IV EMUL
INTRAVENOUS | Status: DC | PRN
  Administered 2019-04-13: 120 ug/kg/min via INTRAVENOUS

## 2019-04-13 MED ORDER — PHENYLEPHRINE HCL-NACL 10-0.9 MG/250ML-% IV SOLN
INTRAVENOUS | Status: DC | PRN
  Administered 2019-04-13: 20 ug/min via INTRAVENOUS

## 2019-04-13 MED ORDER — PROMETHAZINE HCL 25 MG/ML IJ SOLN: 6.2500 mg | INTRAMUSCULAR | Status: DC | PRN

## 2019-04-13 MED ORDER — BISACODYL 10 MG RE SUPP: 10.0000 mg | Freq: Every day | RECTAL | Status: DC | PRN

## 2019-04-13 MED ORDER — ACETAMINOPHEN 500 MG PO TABS
1000.0000 mg | ORAL_TABLET | Freq: Once | ORAL | Status: AC
  Administered 2019-04-13: 11:00:00 1000 mg via ORAL
  Filled 2019-04-13: qty 2

## 2019-04-13 MED ORDER — CEFAZOLIN SODIUM-DEXTROSE 2-4 GM/100ML-% IV SOLN
2.0000 g | Freq: Four times a day (QID) | INTRAVENOUS | Status: AC
  Administered 2019-04-13 – 2019-04-14 (×2): 2 g via INTRAVENOUS
  Filled 2019-04-13 (×2): qty 100

## 2019-04-13 MED ORDER — HYDROMORPHONE HCL 1 MG/ML IJ SOLN
INTRAMUSCULAR | Status: AC
  Filled 2019-04-13: qty 1

## 2019-04-13 MED ORDER — PROPOFOL 10 MG/ML IV BOLUS
INTRAVENOUS | Status: AC
  Filled 2019-04-13: qty 40

## 2019-04-13 MED ORDER — ASPIRIN EC 325 MG PO TBEC
325.0000 mg | DELAYED_RELEASE_TABLET | Freq: Every day | ORAL | Status: DC
  Administered 2019-04-14: 325 mg via ORAL
  Filled 2019-04-13: qty 1

## 2019-04-13 MED ORDER — OXYCODONE HCL 5 MG PO TABS: 10.0000 mg | ORAL_TABLET | ORAL | Status: DC | PRN

## 2019-04-13 MED ORDER — BACLOFEN 10 MG PO TABS: 10.0000 mg | ORAL_TABLET | Freq: Three times a day (TID) | ORAL | 0 refills | Status: DC

## 2019-04-13 MED ORDER — ZOLPIDEM TARTRATE 5 MG PO TABS: 5.0000 mg | ORAL_TABLET | Freq: Every evening | ORAL | Status: DC | PRN

## 2019-04-13 MED ORDER — ONDANSETRON HCL 4 MG PO TABS: 4.0000 mg | ORAL_TABLET | Freq: Four times a day (QID) | ORAL | Status: DC | PRN

## 2019-04-13 MED ORDER — METOCLOPRAMIDE HCL 5 MG/ML IJ SOLN: 5.0000 mg | Freq: Three times a day (TID) | INTRAMUSCULAR | Status: DC | PRN

## 2019-04-13 MED ORDER — TRANEXAMIC ACID-NACL 1000-0.7 MG/100ML-% IV SOLN
1000.0000 mg | Freq: Once | INTRAVENOUS | Status: AC
  Administered 2019-04-13: 1000 mg via INTRAVENOUS
  Filled 2019-04-13: qty 100

## 2019-04-13 MED ORDER — OXYCODONE HCL 5 MG PO TABS
5.0000 mg | ORAL_TABLET | ORAL | Status: DC | PRN
  Filled 2019-04-13: qty 1

## 2019-04-13 MED ORDER — DEXAMETHASONE SODIUM PHOSPHATE 10 MG/ML IJ SOLN
10.0000 mg | Freq: Once | INTRAMUSCULAR | Status: AC
  Administered 2019-04-14: 10 mg via INTRAVENOUS
  Filled 2019-04-13: qty 1

## 2019-04-13 MED ORDER — POLYETHYLENE GLYCOL 3350 17 G PO PACK: 17.0000 g | PACK | Freq: Every day | ORAL | Status: DC | PRN

## 2019-04-13 MED ORDER — HYDROCHLOROTHIAZIDE 25 MG PO TABS
25.0000 mg | ORAL_TABLET | Freq: Every day | ORAL | Status: DC
  Administered 2019-04-13 – 2019-04-14 (×2): 25 mg via ORAL
  Filled 2019-04-13 (×2): qty 1

## 2019-04-13 MED ORDER — ONDANSETRON HCL 4 MG/2ML IJ SOLN: 4.0000 mg | Freq: Four times a day (QID) | INTRAMUSCULAR | Status: DC | PRN

## 2019-04-13 MED ORDER — HYDROMORPHONE HCL 1 MG/ML IJ SOLN
0.2500 mg | INTRAMUSCULAR | Status: DC | PRN
  Administered 2019-04-13: 0.25 mg via INTRAVENOUS

## 2019-04-13 MED ORDER — ALBUTEROL SULFATE (2.5 MG/3ML) 0.083% IN NEBU: 3.0000 mL | INHALATION_SOLUTION | Freq: Four times a day (QID) | RESPIRATORY_TRACT | Status: DC | PRN

## 2019-04-13 MED ORDER — POVIDONE-IODINE 10 % EX SWAB
2.0000 "application " | Freq: Once | CUTANEOUS | Status: AC
  Administered 2019-04-13: 2 via TOPICAL

## 2019-04-13 MED ORDER — MIDAZOLAM HCL 2 MG/2ML IJ SOLN
INTRAMUSCULAR | Status: AC
  Filled 2019-04-13: qty 2

## 2019-04-13 MED ORDER — KETOROLAC TROMETHAMINE 30 MG/ML IJ SOLN
INTRAMUSCULAR | Status: DC | PRN
  Administered 2019-04-13: 30 mg via INTRAMUSCULAR

## 2019-04-13 MED ORDER — ONDANSETRON HCL 4 MG/2ML IJ SOLN
INTRAMUSCULAR | Status: AC
  Filled 2019-04-13: qty 2

## 2019-04-13 MED ORDER — KETOROLAC TROMETHAMINE 30 MG/ML IJ SOLN
INTRAMUSCULAR | Status: AC
  Filled 2019-04-13: qty 1

## 2019-04-13 MED ORDER — DIPHENHYDRAMINE HCL 12.5 MG/5ML PO ELIX: 12.5000 mg | ORAL_SOLUTION | ORAL | Status: DC | PRN

## 2019-04-13 MED ORDER — LOSARTAN POTASSIUM 50 MG PO TABS
100.0000 mg | ORAL_TABLET | Freq: Every day | ORAL | Status: DC
  Administered 2019-04-13 – 2019-04-14 (×2): 100 mg via ORAL
  Filled 2019-04-13 (×2): qty 2

## 2019-04-13 MED ORDER — FENTANYL CITRATE (PF) 100 MCG/2ML IJ SOLN
INTRAMUSCULAR | Status: AC
  Filled 2019-04-13: qty 2

## 2019-04-13 MED ORDER — MENTHOL 3 MG MT LOZG: 1.0000 | LOZENGE | OROMUCOSAL | Status: DC | PRN

## 2019-04-13 MED ORDER — OXYCODONE HCL 5 MG/5ML PO SOLN: 5.0000 mg | Freq: Once | ORAL | Status: DC | PRN

## 2019-04-13 MED ORDER — PROPOFOL 500 MG/50ML IV EMUL
INTRAVENOUS | Status: AC
  Filled 2019-04-13: qty 100

## 2019-04-13 MED ORDER — ONDANSETRON HCL 4 MG PO TABS: 4.0000 mg | ORAL_TABLET | Freq: Three times a day (TID) | ORAL | 0 refills | Status: DC | PRN

## 2019-04-13 MED ORDER — OXYCODONE HCL 5 MG PO TABS: 5.0000 mg | ORAL_TABLET | ORAL | 0 refills | Status: DC | PRN

## 2019-04-13 MED ORDER — MAGNESIUM CITRATE PO SOLN: 1.0000 | Freq: Once | ORAL | Status: DC | PRN

## 2019-04-13 MED ORDER — DOCUSATE SODIUM 100 MG PO CAPS
100.0000 mg | ORAL_CAPSULE | Freq: Two times a day (BID) | ORAL | Status: DC
  Administered 2019-04-13 – 2019-04-14 (×2): 100 mg via ORAL
  Filled 2019-04-13 (×2): qty 1

## 2019-04-13 MED ORDER — 0.9 % SODIUM CHLORIDE (POUR BTL) OPTIME
TOPICAL | Status: DC | PRN
  Administered 2019-04-13: 1000 mL

## 2019-04-13 MED ORDER — BUPIVACAINE HCL (PF) 0.25 % IJ SOLN
INTRAMUSCULAR | Status: AC
  Filled 2019-04-13: qty 30

## 2019-04-13 MED ORDER — MIDAZOLAM HCL 5 MG/5ML IJ SOLN
INTRAMUSCULAR | Status: DC | PRN
  Administered 2019-04-13: 2 mg via INTRAVENOUS

## 2019-04-13 MED ORDER — METHOCARBAMOL 1000 MG/10ML IJ SOLN: 500.0000 mg | Freq: Four times a day (QID) | INTRAVENOUS | Status: DC | PRN

## 2019-04-13 MED ORDER — PHENOL 1.4 % MT LIQD: 1.0000 | OROMUCOSAL | Status: DC | PRN

## 2019-04-13 MED ORDER — CHLORHEXIDINE GLUCONATE 4 % EX LIQD: 60.0000 mL | Freq: Once | CUTANEOUS | Status: DC

## 2019-04-13 MED ORDER — STERILE WATER FOR IRRIGATION IR SOLN
Status: DC | PRN
  Administered 2019-04-13: 2000 mL

## 2019-04-13 MED ORDER — OXYCODONE HCL 5 MG PO TABS: 5.0000 mg | ORAL_TABLET | Freq: Once | ORAL | Status: DC | PRN

## 2019-04-13 MED ORDER — ASPIRIN EC 325 MG PO TBEC: 325.0000 mg | DELAYED_RELEASE_TABLET | Freq: Two times a day (BID) | ORAL | 0 refills | Status: DC

## 2019-04-13 MED ORDER — LORATADINE 10 MG PO TABS
10.0000 mg | ORAL_TABLET | Freq: Every day | ORAL | Status: DC
  Administered 2019-04-13 – 2019-04-14 (×2): 10 mg via ORAL
  Filled 2019-04-13 (×2): qty 1

## 2019-04-13 MED ORDER — SENNA-DOCUSATE SODIUM 8.6-50 MG PO TABS: 2.0000 | ORAL_TABLET | Freq: Every day | ORAL | 1 refills | Status: DC

## 2019-04-13 MED ORDER — TRANEXAMIC ACID-NACL 1000-0.7 MG/100ML-% IV SOLN
1000.0000 mg | INTRAVENOUS | Status: AC
  Administered 2019-04-13: 14:00:00 1000 mg via INTRAVENOUS
  Filled 2019-04-13: qty 100

## 2019-04-13 MED ORDER — ACETAMINOPHEN 500 MG PO TABS
1000.0000 mg | ORAL_TABLET | Freq: Four times a day (QID) | ORAL | Status: DC
  Administered 2019-04-13 – 2019-04-14 (×3): 1000 mg via ORAL
  Filled 2019-04-13 (×3): qty 2

## 2019-04-13 MED ORDER — DEXAMETHASONE SODIUM PHOSPHATE 10 MG/ML IJ SOLN
INTRAMUSCULAR | Status: AC
  Filled 2019-04-13: qty 1

## 2019-04-13 MED ORDER — PHENYLEPHRINE 40 MCG/ML (10ML) SYRINGE FOR IV PUSH (FOR BLOOD PRESSURE SUPPORT)
PREFILLED_SYRINGE | INTRAVENOUS | Status: AC
  Filled 2019-04-13: qty 20

## 2019-04-13 MED ORDER — ALBUTEROL SULFATE HFA 108 (90 BASE) MCG/ACT IN AERS
INHALATION_SPRAY | RESPIRATORY_TRACT | Status: AC
  Filled 2019-04-13: qty 6.7

## 2019-04-13 MED ORDER — ASCORBIC ACID 500 MG PO TABS
1000.0000 mg | ORAL_TABLET | Freq: Every day | ORAL | Status: DC
  Administered 2019-04-13 – 2019-04-14 (×2): 1000 mg via ORAL
  Filled 2019-04-13 (×2): qty 2

## 2019-04-13 MED ORDER — HYDROMORPHONE HCL 1 MG/ML IJ SOLN: 0.5000 mg | INTRAMUSCULAR | Status: DC | PRN

## 2019-04-13 MED ORDER — GLYCOPYRROLATE 0.2 MG/ML IJ SOLN
INTRAMUSCULAR | Status: DC | PRN
  Administered 2019-04-13: .2 mg via INTRAVENOUS

## 2019-04-13 SURGICAL SUPPLY — 61 items
BIT DRILL 2.0X128 (BIT) ×2 IMPLANT
BIT DRILL 2.0X128MM (BIT) ×1
BLADE SAW SAG 73X25 THK (BLADE) ×1
BLADE SAW SGTL 73X25 THK (BLADE) ×2 IMPLANT
CLOSURE STERI-STRIP 1/2X4 (GAUZE/BANDAGES/DRESSINGS) ×2
CLSR STERI-STRIP ANTIMIC 1/2X4 (GAUZE/BANDAGES/DRESSINGS) ×4 IMPLANT
COVER SURGICAL LIGHT HANDLE (MISCELLANEOUS) ×3 IMPLANT
COVER WAND RF STERILE (DRAPES) IMPLANT
CUP ACET PNNCL SECTR W/GRIP 56 (Hips) IMPLANT
DRAPE INCISE IOBAN 66X45 STRL (DRAPES) ×3 IMPLANT
DRAPE ORTHO SPLIT 77X108 STRL (DRAPES) ×6
DRAPE POUCH INSTRU U-SHP 10X18 (DRAPES) ×3 IMPLANT
DRAPE SHEET LG 3/4 BI-LAMINATE (DRAPES) ×3 IMPLANT
DRAPE SURG 17X11 SM STRL (DRAPES) ×3 IMPLANT
DRAPE SURG ORHT 6 SPLT 77X108 (DRAPES) ×2 IMPLANT
DRAPE U-SHAPE 47X51 STRL (DRAPES) ×3 IMPLANT
DRSG MEPILEX BORDER 4X12 (GAUZE/BANDAGES/DRESSINGS) ×2 IMPLANT
DRSG MEPILEX BORDER 4X8 (GAUZE/BANDAGES/DRESSINGS) ×1 IMPLANT
DURAPREP 26ML APPLICATOR (WOUND CARE) ×6 IMPLANT
ELECT BLADE TIP CTD 4 INCH (ELECTRODE) ×3 IMPLANT
ELECT REM PT RETURN 15FT ADLT (MISCELLANEOUS) ×3 IMPLANT
ELIMINATOR HOLE APEX DEPUY (Hips) ×2 IMPLANT
FACESHIELD WRAPAROUND (MASK) ×3 IMPLANT
FACESHIELD WRAPAROUND OR TEAM (MASK) ×1 IMPLANT
GLOVE BIO SURGEON STRL SZ7 (GLOVE) ×3 IMPLANT
GLOVE BIOGEL PI IND STRL 7.0 (GLOVE) ×1 IMPLANT
GLOVE BIOGEL PI IND STRL 8 (GLOVE) ×1 IMPLANT
GLOVE BIOGEL PI INDICATOR 7.0 (GLOVE) ×2
GLOVE BIOGEL PI INDICATOR 8 (GLOVE) ×2
GLOVE SURG SS PI 7.5 STRL IVOR (GLOVE) ×3 IMPLANT
GOWN STRL REUS W/TWL LRG LVL3 (GOWN DISPOSABLE) ×6 IMPLANT
HEAD CERAMIC DELTA 36 PLUS 1.5 (Hips) ×2 IMPLANT
HOOD PEEL AWAY FLYTE STAYCOOL (MISCELLANEOUS) ×9 IMPLANT
KIT BASIN OR (CUSTOM PROCEDURE TRAY) ×3 IMPLANT
KIT TURNOVER KIT A (KITS) ×3 IMPLANT
MANIFOLD NEPTUNE II (INSTRUMENTS) ×3 IMPLANT
NDL SAFETY ECLIPSE 18X1.5 (NEEDLE) ×2 IMPLANT
NEEDLE HYPO 18GX1.5 SHARP (NEEDLE) ×6
NS IRRIG 1000ML POUR BTL (IV SOLUTION) ×3 IMPLANT
PACK TOTAL JOINT (CUSTOM PROCEDURE TRAY) ×3 IMPLANT
PENCIL SMOKE EVACUATOR (MISCELLANEOUS) IMPLANT
PINN SECTOR W/GRIP ACE CUP 56 (Hips) ×3 IMPLANT
PINNACLE ALTRX PLUS 4 N 36X56 (Hips) ×2 IMPLANT
PROTECTOR NERVE ULNAR (MISCELLANEOUS) ×3 IMPLANT
RETRIEVER SUT HEWSON (MISCELLANEOUS) ×3 IMPLANT
SCREW 6.5MMX25MM (Screw) ×2 IMPLANT
STEM FEMORAL SZ6 150MMLX41MML (Hips) ×2 IMPLANT
SUCTION FRAZIER HANDLE 12FR (TUBING) ×2
SUCTION TUBE FRAZIER 12FR DISP (TUBING) ×1 IMPLANT
SUT FIBERWIRE #2 38 REV NDL BL (SUTURE) ×9
SUT VIC AB 0 CT1 36 (SUTURE) ×3 IMPLANT
SUT VIC AB 1 CT1 36 (SUTURE) ×8 IMPLANT
SUT VIC AB 2-0 CT1 27 (SUTURE) ×6
SUT VIC AB 2-0 CT1 TAPERPNT 27 (SUTURE) ×2 IMPLANT
SUT VIC AB 3-0 SH 8-18 (SUTURE) ×3 IMPLANT
SUTURE FIBERWR#2 38 REV NDL BL (SUTURE) ×3 IMPLANT
SYR CONTROL 10ML LL (SYRINGE) ×6 IMPLANT
TOWEL OR 17X26 10 PK STRL BLUE (TOWEL DISPOSABLE) ×3 IMPLANT
TRAY FOLEY MTR SLVR 16FR STAT (SET/KITS/TRAYS/PACK) ×3 IMPLANT
WATER STERILE IRR 1000ML POUR (IV SOLUTION) ×6 IMPLANT
YANKAUER SUCT BULB TIP 10FT TU (MISCELLANEOUS) ×5 IMPLANT

## 2019-04-13 NOTE — Op Note (Signed)
04/13/2019  4:06 PM  PATIENT:  David Reed   MRN: [id number]  PRE-OPERATIVE DIAGNOSIS:  Left hip primary localized osteoarthritis  POST-OPERATIVE DIAGNOSIS:  same  PROCEDURE:  Procedure(s): TOTAL HIP ARTHROPLASTY  PREOPERATIVE INDICATIONS:    David Reed is an 63 y.o. male who has a diagnosis of left hip oa and elected for surgical management after failing conservative treatment.  The risks benefits and alternatives were discussed with the patient including but not limited to the risks of nonoperative treatment, versus surgical intervention including infection, bleeding, nerve injury, periprosthetic fracture, the need for revision surgery, dislocation, leg length discrepancy, blood clots, cardiopulmonary complications, morbidity, mortality, among others, and they were willing to proceed.     OPERATIVE REPORT     SURGEON:  Marchia Bond, MD    ASSISTANT:  Merlene Pulling, PA-C, (Present throughout the entire procedure,  necessary for completion of procedure in a timely manner, assisting with retraction, instrumentation, and closure)     ANESTHESIA:  spinal  ESTIMATED BLOOD LOSS: 193 ml    COMPLICATIONS:  None.     UNIQUE ASPECTS OF THE CASE:  He had a varus neck angle and was fairly high offset.  The inferior capsule closed nicely, the superior barely made contact with the trochanter.  Access to the acetabulum was a little challenging, but not bad, I cut the neck just less than a thumb's breadth from the lesser.    COMPONENTS:  [employer] fit high offset femur size 6 with a 36 mm +1.5 ceramic head ball and a Gription Acetabular shell size 56, with a single cancellous screw for backup fixation, with an apex hole eliminator and a +4 neutral polyethylene liner.    PROCEDURE IN DETAIL:   The patient was met in the holding area and  identified.  The appropriate hip was identified and marked at the operative site.  The patient was then transported to the OR  and  placed  under anesthesia.  At that point, the patient was  placed in the lateral decubitus position with the operative side up and  secured to the operating room table and all bony prominences padded.     The operative lower extremity was prepped from the iliac crest to the distal leg.  Sterile draping was performed.  Time out was performed prior to incision.      A routine posterolateral approach was utilized via sharp dissection  carried down to the subcutaneous tissue.  Gross bleeders were Bovie coagulated.  The iliotibial band was identified and incised along the length of the skin incision.  Self-retaining retractors were  inserted.  With the hip internally rotated, the short external rotators  were identified. The piriformis and capsule was tagged with FiberWire, and the hip capsule released in a T-type fashion.  The femoral neck was exposed, and I resected the femoral neck using the appropriate jig. This was performed at approximately a thumb's breadth above the lesser trochanter.    I then exposed the deep acetabulum, cleared out any tissue including the ligamentum teres.  A wing retractor was placed.  After adequate visualization, I excised the labrum, and then sequentially reamed.  I placed the trial acetabulum, which seated nicely, and then impacted the real cup into place.  Appropriate version and inclination was confirmed clinically matching their bony anatomy, and also with the use of the jig.  I placed a cancellous screw to augment fixation.  A trial polyethylene liner was placed and the wing retractor removed.  I then prepared the proximal femur using the cookie-cutter, the lateralizing reamer, and then sequentially reamed and broached.  A trial broach, neck, and head was utilized, and I reduced the hip and it was found to have excellent stability with functional range of motion. The trial components were then removed, and the real polyethylene liner was placed.  I then impacted the real  femoral prosthesis into place into the appropriate version, slightly anteverted to the normal anatomy, and I impacted the real head ball into place. The hip was then reduced and taken through functional range of motion and found to have excellent stability. Leg lengths were restored.  I then used a 2 mm drill bits to pass the FiberWire suture from the capsule and piriformis through the greater trochanter, and secured this. Excellent posterior capsular repair was achieved. I also closed the T in the capsule.  I then irrigated the hip copiously again with pulse lavage, and repaired the fascia with Vicryl, followed by Vicryl for the subcutaneous tissue, Monocryl for the skin, Steri-Strips and sterile gauze. The wounds were injected. The patient was then awakened and returned to PACU in stable and satisfactory condition. There were no complications.  Marchia Bond, MD Orthopedic Surgeon [phone]   04/13/2019 4:06 PM

## 2019-04-13 NOTE — Discharge Instructions (Signed)
INSTRUCTIONS AFTER JOINT REPLACEMENT   o Remove items at home which could result in a fall. This includes throw rugs or furniture in walking pathways o ICE to the affected joint every three hours while awake for 30 minutes at a time, for at least the first 3-5 days, and then as needed for pain and swelling.  Continue to use ice for pain and swelling. You may notice swelling that will progress down to the foot and ankle.  This is normal after surgery.  Elevate your leg when you are not up walking on it.   o Continue to use the breathing machine you got in the hospital (incentive spirometer) which will help keep your temperature down.  It is common for your temperature to cycle up and down following surgery, especially at night when you are not up moving around and exerting yourself.  The breathing machine keeps your lungs expanded and your temperature down.   DIET:  As you were doing prior to hospitalization, we recommend a well-balanced diet.  DRESSING / WOUND CARE / SHOWERING  You may change your dressing 3-5 days after surgery.  Then change the dressing every day with sterile gauze.  Please use good hand washing techniques before changing the dressing.  Do not use any lotions or creams on the incision until instructed by your surgeon.  ACTIVITY  o Increase activity slowly as tolerated, but follow the weight bearing instructions below.   o No driving for 6 weeks or until further direction given by your physician.  You cannot drive while taking narcotics.  o No lifting or carrying greater than 10 lbs. until further directed by your surgeon. o Avoid periods of inactivity such as sitting longer than an hour when not asleep. This helps prevent blood clots.  o You may return to work once you are authorized by your doctor.     WEIGHT BEARING   Weight bearing as tolerated with assist device (walker, cane, etc) as directed, use it as long as suggested by your surgeon or therapist, typically at  least 4-6 weeks.   EXERCISES  Results after joint replacement surgery are often greatly improved when you follow the exercise, range of motion and muscle strengthening exercises prescribed by your doctor. Safety measures are also important to protect the joint from further injury. Any time any of these exercises cause you to have increased pain or swelling, decrease what you are doing until you are comfortable again and then slowly increase them. If you have problems or questions, call your caregiver or physical therapist for advice.   Rehabilitation is important following a joint replacement. After just a few days of immobilization, the muscles of the leg can become weakened and shrink (atrophy).  These exercises are designed to build up the tone and strength of the thigh and leg muscles and to improve motion. Often times heat used for twenty to thirty minutes before working out will loosen up your tissues and help with improving the range of motion but do not use heat for the first two weeks following surgery (sometimes heat can increase post-operative swelling).   These exercises can be done on a training (exercise) mat, on the floor, on a table or on a bed. Use whatever works the best and is most comfortable for you.    Use music or television while you are exercising so that the exercises are a pleasant break in your day. This will make your life better with the exercises acting as a break   in your routine that you can look forward to.   Perform all exercises about fifteen times, three times per day or as directed.  You should exercise both the operative leg and the other leg as well.  Exercises include:   . Quad Sets - Tighten up the muscle on the front of the thigh (Quad) and hold for 5-10 seconds.   . Straight Leg Raises - With your knee straight (if you were given a brace, keep it on), lift the leg to 60 degrees, hold for 3 seconds, and slowly lower the leg.  Perform this exercise against  resistance later as your leg gets stronger.  . Leg Slides: Lying on your back, slowly slide your foot toward your buttocks, bending your knee up off the floor (only go as far as is comfortable). Then slowly slide your foot back down until your leg is flat on the floor again.  . Angel Wings: Lying on your back spread your legs to the side as far apart as you can without causing discomfort.  . Hamstring Strength:  Lying on your back, push your heel against the floor with your leg straight by tightening up the muscles of your buttocks.  Repeat, but this time bend your knee to a comfortable angle, and push your heel against the floor.  You may put a pillow under the heel to make it more comfortable if necessary.   A rehabilitation program following joint replacement surgery can speed recovery and prevent re-injury in the future due to weakened muscles. Contact your doctor or a physical therapist for more information on knee rehabilitation.    CONSTIPATION  Constipation is defined medically as fewer than three stools per week and severe constipation as less than one stool per week.  Even if you have a regular bowel pattern at home, your normal regimen is likely to be disrupted due to multiple reasons following surgery.  Combination of anesthesia, postoperative narcotics, change in appetite and fluid intake all can affect your bowels.   YOU MUST use at least one of the following options; they are listed in order of increasing strength to get the job done.  They are all available over the counter, and you may need to use some, POSSIBLY even all of these options:    Drink plenty of fluids (prune juice may be helpful) and high fiber foods Colace 100 mg by mouth twice a day  Senokot for constipation as directed and as needed Dulcolax (bisacodyl), take with full glass of water  Miralax (polyethylene glycol) once or twice a day as needed.  If you have tried all these things and are unable to have a bowel  movement in the first 3-4 days after surgery call either your surgeon or your primary doctor.    If you experience loose stools or diarrhea, hold the medications until you stool forms back up.  If your symptoms do not get better within 1 week or if they get worse, check with your doctor.  If you experience "the worst abdominal pain ever" or develop nausea or vomiting, please contact the office immediately for further recommendations for treatment.   ITCHING:  If you experience itching with your medications, try taking only a single pain pill, or even half a pain pill at a time.  You can also use Benadryl over the counter for itching or also to help with sleep.   TED HOSE STOCKINGS:  Use stockings on both legs until for at least 2 weeks or as   directed by physician office. They may be removed at night for sleeping.  MEDICATIONS:  See your medication summary on the "After Visit Summary" that nursing will review with you.  You may have some home medications which will be placed on hold until you complete the course of blood thinner medication.  It is important for you to complete the blood thinner medication as prescribed.  PRECAUTIONS:  If you experience chest pain or shortness of breath - call 911 immediately for transfer to the hospital emergency department.   If you develop a fever greater that 101 F, purulent drainage from wound, increased redness or drainage from wound, foul odor from the wound/dressing, or calf pain - CONTACT YOUR SURGEON.                                                   FOLLOW-UP APPOINTMENTS:  If you do not already have a post-op appointment, please call the office for an appointment to be seen by your surgeon.  Guidelines for how soon to be seen are listed in your "After Visit Summary", but are typically between 1-4 weeks after surgery.  OTHER INSTRUCTIONS:   MAKE SURE YOU:  . Understand these instructions.  . Get help right away if you are not doing well or get worse.     Thank you for letting us be a part of your medical care team.  It is a privilege we respect greatly.  We hope these instructions will help you stay on track for a fast and full recovery!

## 2019-04-13 NOTE — Anesthesia Procedure Notes (Addendum)
Procedure Name: MAC Date/Time: 04/13/2019 1:28 PM Performed by: West Pugh, CRNA Pre-anesthesia Checklist: Patient identified, Emergency Drugs available, Suction available, Patient being monitored and Timeout performed Patient Re-evaluated:Patient Re-evaluated prior to induction Oxygen Delivery Method: Simple face mask Preoxygenation: Pre-oxygenation with 100% oxygen Induction Type: IV induction Placement Confirmation: positive ETCO2 Dental Injury: Teeth and Oropharynx as per pre-operative assessment

## 2019-04-13 NOTE — Care Plan (Signed)
Ortho Bundle Case Management Note  Patient Details  Name: David Reed MRN: [id number] Date of Birth: 05-Mar-1956  Spoke with patient prior to surgery. He plans to discharge to home, same day if possible, with family and HHPT. Referral to [facility]. Rolling walker ordered from [place]. He will transition to OPPT at [facility] on 04/26/19 following MD appointment. Patient and MD in agreement with plan. Choice offered.                   DME Arranged:  [facility] Rile rolling DME Agency:  [employer]  HH Arranged:  PT HH Agency:  [facility] ([place])  Additional Comments: Please contact me with any questions of if this plan should need to change.  Ladell Heads,  [place] [facility]  [phone] 04/13/2019, 9:06 AM

## 2019-04-13 NOTE — Transfer of Care (Signed)
Immediate Anesthesia Transfer of Care Note  Patient: Denilson Salminen  Procedure(s) Performed: TOTAL HIP ARTHROPLASTY (Left Hip)  Patient Location: PACU  Anesthesia Type:MAC and Spinal  Level of Consciousness: awake, alert  and patient cooperative  Airway & Oxygen Therapy: Patient Spontanous Breathing and Patient connected to face mask oxygen  Post-op Assessment: Report given to RN and Post -op Vital signs reviewed and stable  Post vital signs: Reviewed and stable  Last Vitals:  Vitals Value Taken Time  BP 160/86 04/13/19 1634  Temp    Pulse 74 04/13/19 1638  Resp 13 04/13/19 1638  SpO2 100 % 04/13/19 1638  Vitals shown include unvalidated device data.  Last Pain:  Vitals:   04/13/19 1634  TempSrc:   PainSc: (P) Asleep      Patients Stated Pain Goal: 3 (02/58/52 7782)  Complications: No apparent anesthesia complications

## 2019-04-13 NOTE — Anesthesia Procedure Notes (Addendum)
Spinal  Patient location during procedure: OR Start time: 04/13/2019 1:33 PM End time: 04/13/2019 1:39 PM Reason for block: at surgeon's request Staffing Performed: resident/CRNA  Resident/CRNA: West Pugh, CRNA Preanesthetic Checklist Completed: patient identified, IV checked, site marked, risks and benefits discussed, surgical consent, monitors and equipment checked, pre-op evaluation and timeout performed Spinal Block Patient position: sitting Prep: DuraPrep Patient monitoring: heart rate, cardiac monitor, continuous pulse ox and blood pressure Approach: midline Location: L3-4 Injection technique: single-shot Needle Needle type: Pencan  Needle gauge: 24 G Needle length: 9 cm Assessment Sensory level: T4 Additional Notes Expiration of kit checked and confirmed. Patient tolerated procedure well,without complications with noted clear CSF. Loss of motor and sensory on exam post injection. Dr Roanna Banning present for procedure.

## 2019-04-13 NOTE — H&P (Signed)
PREOPERATIVE H&P  Chief Complaint: Left hip pain  HPI: David Reed is a 63 y.o. male who presents for preoperative history and physical with a diagnosis of left hip primary localized osteoarthritis. Symptoms are rated as moderate to severe, and have been worsening.  This is significantly impairing activities of daily living.  He has elected for surgical management.   He has failed injections, activity modification, anti-inflammatories, and assistive devices.  Preoperative X-rays demonstrate end stage degenerative changes with osteophyte formation, loss of joint space, subchondral sclerosis.   Past Medical History:  Diagnosis Date  . Allergy   . Arthritis   . DDD (degenerative disc disease), lumbar    no meds  . Hypertension    Past Surgical History:  Procedure Laterality Date  . ROTATOR CUFF REPAIR     right shoulder surgery   Social History   Socioeconomic History  . Marital status: Married    Spouse name: Not on file  . Number of children: Not on file  . Years of education: Not on file  . Highest education level: Not on file  Occupational History  . Not on file  Tobacco Use  . Smoking status: Never Smoker  . Smokeless tobacco: Never Used  Substance and Sexual Activity  . Alcohol use: Yes    Alcohol/week: 1.0 standard drinks    Types: 1 Cans of beer per week    Comment: daily  . Drug use: Yes    Types: Marijuana  . Sexual activity: Yes  Other Topics Concern  . Not on file  Social History Narrative  . Not on file   Social Determinants of Health   Financial Resource Strain:   . Difficulty of Paying Living Expenses: Not on file  Food Insecurity:   . Worried About Charity fundraiser in the Last Year: Not on file  . Ran Out of Food in the Last Year: Not on file  Transportation Needs:   . Lack of Transportation (Medical): Not on file  . Lack of Transportation (Non-Medical): Not on file  Physical Activity:   . Days of Exercise per Week: Not on file  .  Minutes of Exercise per Session: Not on file  Stress:   . Feeling of Stress : Not on file  Social Connections:   . Frequency of Communication with Friends and Family: Not on file  . Frequency of Social Gatherings with Friends and Family: Not on file  . Attends Religious Services: Not on file  . Active Member of Clubs or Organizations: Not on file  . Attends Archivist Meetings: Not on file  . Marital Status: Not on file   Family History  Problem Relation Age of Onset  . Alcoholism Father    No Known Allergies Prior to Admission medications   Medication Sig Start Date End Date Taking? Authorizing Provider  albuterol (VENTOLIN HFA) 108 (90 Base) MCG/ACT inhaler Inhale 1-2 puffs into the lungs every 6 (six) hours as needed for wheezing or shortness of breath.   Yes [provider]  Ascorbic Acid (VITAMIN C) 1000 MG tablet Take 1,000 mg by mouth daily.   Yes [provider]  cetirizine (ZYRTEC) 10 MG tablet Take 10 mg by mouth daily.   Yes [provider]  Digestive Enzymes (DIGESTIVE ENZYME PO) Take 2 capsules by mouth 2 (two) times daily. Primagest Digestive Enzymes Nutritional Supplement   Yes [provider]  FIBER PO Take 15 mLs by mouth daily. *Mix with water & lemon juice*  Baltone Powder   Yes [provider]  hydrochlorothiazide (HYDRODIURIL) 25 MG tablet Take 25 mg by mouth daily.   Yes [provider]  ibuprofen (ADVIL) 200 MG tablet Take 400-600 mg by mouth 2 (two) times daily as needed (pain.).   Yes [provider]  losartan (COZAAR) 100 MG tablet Take 100 mg by mouth daily.   Yes [provider]  Multiple Vitamin (MULTIVITAMIN WITH MINERALS) TABS tablet Take 1 tablet by mouth daily.   Yes [provider]  NON FORMULARY Take 1-2 capsules by mouth 2 (two) times daily as needed (gas).    Yes [provider]  Omega 3 1200 MG CAPS Take 2,400 mg by mouth daily.   Yes [provider]  Protease POWD Take 1 capsule by mouth daily. Protease Enzyme   Yes [provider]     Positive ROS: All other systems have been reviewed and were otherwise negative with the exception of those mentioned in the HPI and as above.  Physical Exam: General: Alert, no acute distress Cardiovascular: No pedal edema Respiratory: No cyanosis, no use of accessory musculature GI: No organomegaly, abdomen is soft and non-tender Skin: No lesions in the area of chief complaint Neurologic: Sensation intact distally Psychiatric: Patient is competent for consent with normal mood and affect Lymphatic: No axillary or cervical lymphadenopathy  MUSCULOSKELETAL: Left hip has a painful arc of motion, 0 to 80 degrees, minimal internal rotation secondary to pain, EHL intact.  Assessment: Left hip primary localized osteoarthritis   Plan: Plan for Procedure(s): TOTAL HIP ARTHROPLASTY  The risks benefits and alternatives were discussed with the patient including but not limited to the risks of nonoperative treatment, versus surgical intervention including infection, bleeding, nerve injury, periprosthetic fracture, the need for revision surgery, dislocation, leg length discrepancy, blood clots, cardiopulmonary complications, morbidity, mortality, among others, and they were willing to proceed.     Anticipated LOS equal to or greater than 2 midnights due to - Age 29 and older with one or more of the following:  - Obesity  - Expected need for hospital services (PT, OT, Nursing) required for safe  discharge  - Anticipated need for postoperative skilled nursing care or inpatient rehab  - Active co-morbidities: None OR   - Unanticipated findings during/Post Surgery: None  - Patient is a high risk of re-admission due to: None     Johnny Bridge, MD Cell [phone]   04/13/2019 12:49 PM

## 2019-04-13 NOTE — Anesthesia Preprocedure Evaluation (Addendum)
Anesthesia Evaluation  Patient identified by MRN, date of birth, ID band Patient awake    Reviewed: Allergy & Precautions, NPO status , Patient's Chart, lab work & pertinent test results  Airway Mallampati: III  TM Distance: >3 FB Neck ROM: Full    Dental no notable dental hx.    Pulmonary neg pulmonary ROS,    Pulmonary exam normal breath sounds clear to auscultation       Cardiovascular hypertension, Pt. on medications Normal cardiovascular exam Rhythm:Regular Rate:Normal  ECG: SB, rate 46. Sinus bradycardia with 1st degree A-V block   Neuro/Psych negative neurological ROS  negative psych ROS   GI/Hepatic negative GI ROS, (+)     substance abuse  ,   Endo/Other  negative endocrine ROS  Renal/GU negative Renal ROS     Musculoskeletal  (+) Arthritis ,   Abdominal   Peds  Hematology negative hematology ROS (+)   Anesthesia Other Findings djd left hip  Reproductive/Obstetrics                            Anesthesia Physical Anesthesia Plan  ASA: II  Anesthesia Plan: Spinal   Post-op Pain Management:    Induction: Intravenous  PONV Risk Score and Plan: 1 and Ondansetron, Dexamethasone, Midazolam and Treatment may vary due to age or medical condition  Airway Management Planned: Simple Face Mask  Additional Equipment:   Intra-op Plan:   Post-operative Plan:   Informed Consent: I have reviewed the patients History and Physical, chart, labs and discussed the procedure including the risks, benefits and alternatives for the proposed anesthesia with the patient or authorized representative who has indicated his/her understanding and acceptance.     Dental advisory given  Plan Discussed with: CRNA  Anesthesia Plan Comments:         Anesthesia Quick Evaluation

## 2019-04-14 ENCOUNTER — Encounter: Payer: Self-pay | Admitting: *Deleted

## 2019-04-14 LAB — BASIC METABOLIC PANEL
Anion gap: 9 (ref 5–15)
BUN: 15 mg/dL (ref 8–23)
CO2: 26 mmol/L (ref 22–32)
Calcium: 9.1 mg/dL (ref 8.9–10.3)
Chloride: 105 mmol/L (ref 98–111)
Creatinine, Ser: 1 mg/dL (ref 0.61–1.24)
GFR calc Af Amer: >60 mL/min (ref >60)
GFR calc non Af Amer: >60 mL/min (ref >60)
Glucose, Bld: 137 mg/dL — ABNORMAL HIGH (ref 70–99)
Potassium: 4 mmol/L (ref 3.5–5.1)
Sodium: 140 mmol/L (ref 135–145)

## 2019-04-14 LAB — CBC
HCT: 40.4 % (ref 39.0–52.0)
Hemoglobin: 12.7 g/dL — ABNORMAL LOW (ref 13.0–17.0)
MCH: 29.6 pg (ref 26.0–34.0)
MCHC: 31.4 g/dL (ref 30.0–36.0)
MCV: 94.2 fL (ref 80.0–100.0)
Platelets: 238 10*3/uL (ref 150–400)
RBC: 4.29 MIL/uL (ref 4.22–5.81)
RDW: 14.2 % (ref 11.5–15.5)
WBC: 7.7 10*3/uL (ref 4.0–10.5)
nRBC: 0 % (ref 0.0–0.2)

## 2019-04-14 NOTE — Anesthesia Postprocedure Evaluation (Signed)
Anesthesia Post Note  Patient: David Reed  Procedure(s) Performed: TOTAL HIP ARTHROPLASTY (Left Hip)     Patient location during evaluation: PACU Anesthesia Type: Spinal Level of consciousness: oriented and awake and alert Pain management: pain level controlled Vital Signs Assessment: post-procedure vital signs reviewed and stable Respiratory status: spontaneous breathing, respiratory function stable and patient connected to nasal cannula oxygen Cardiovascular status: blood pressure returned to baseline and stable Postop Assessment: no headache, no backache, no apparent nausea or vomiting and spinal receding Anesthetic complications: no    Last Vitals:  Vitals:   04/14/19 0005 04/14/19 0610  BP: 137/66 127/64  Pulse: 68 (!) 53  Resp: 16 16  Temp: 36.7 C 36.7 C  SpO2: 100% 98%    Last Pain:  Vitals:   04/14/19 0610  TempSrc: Oral  PainSc:                  Karyl Kinnier Nazaret Chea

## 2019-04-14 NOTE — Evaluation (Signed)
Physical Therapy Evaluation Patient Details Name: David Reed MRN: [id number] DOB: 07-Mar-1956 Today's Date: 04/14/2019   History of Present Illness  s/p L posterior THA. PMH: R RCR, HTN  Clinical Impression  Patient evaluated by Physical Therapy with no further acute PT needs identified. All education has been completed and the patient has no further questions.  Pt seen for gait/HEP, review of posterior THP. Pt is motivated and progressing well. Plan is for HHPT. Ready for d/c from PT standpoint. Discussed need for reacher/AE or having wife assist with dressing. Pt states he is familiar with these items. Will defer to further education HHPT  See below for any follow-up Physical Therapy or equipment needs. PT is signing off. Thank you for this referral.     Follow Up Recommendations Follow surgeon's recommendation for DC plan and follow-up therapies(HHPT)    Equipment Recommendations  Other (comment)(delivered)    Recommendations for Other Services       Precautions / Restrictions Precautions Precautions: Posterior Hip;Fall Restrictions Weight Bearing Restrictions: No      Mobility  Bed Mobility Overal bed mobility: Needs Assistance Bed Mobility: Supine to Sit     Supine to sit: Min guard;Min assist     General bed mobility comments: light assist to initiate movement LLE, cues for technique/THP  Transfers Overall transfer level: Needs assistance Equipment used: Rolling walker (2 wheeled) Transfers: Sit to/from Stand Sit to Stand: Min guard;Supervision         General transfer comment: cues for hand placement and LLE position  Ambulation/Gait Ambulation/Gait assistance: Supervision Gait Distance (Feet): 200 Feet Assistive device: Rolling walker (2 wheeled) Gait Pattern/deviations: Step-to pattern;Step-through pattern;Decreased stance time - left     General Gait Details: cues for sequence and RW position  Stairs            Wheelchair Mobility     Modified Rankin (Stroke Patients Only)       Balance                                             Pertinent Vitals/Pain Pain Assessment: 0-10 Pain Score: 3  Pain Location: left hip Pain Descriptors / Indicators: Sore Pain Intervention(s): Monitored during session;Limited activity within patient's tolerance;Premedicated before session;Repositioned;Ice applied    Home Living Family/patient expects to be discharged to:: Private residence Living Arrangements: Spouse/significant other Available Help at Discharge: Family Type of Home: House Home Access: Level entry     Home Layout: Multi-level Home Equipment: Environmental consultant - 2 wheels Additional Comments: will stay on main level    Prior Function Level of Independence: Independent               Hand Dominance        Extremity/Trunk Assessment   Upper Extremity Assessment Upper Extremity Assessment: Overall WFL for tasks assessed    Lower Extremity Assessment Lower Extremity Assessment: LLE deficits/detail LLE Deficits / Details: hip flexion 2+/5, limited by post op pain/weakness. ankle and knee grossly WFL       Communication   Communication: No difficulties  Cognition Arousal/Alertness: Awake/alert Behavior During Therapy: WFL for tasks assessed/performed Overall Cognitive Status: Within Functional Limits for tasks assessed  General Comments      Exercises Total Joint Exercises Ankle Circles/Pumps: AROM;Both;10 reps Quad Sets: Both;10 reps;AROM Heel Slides: AROM;AAROM;Left;10 reps Hip ABduction/ADduction: AROM;Both;10 reps   Assessment/Plan    PT Assessment All further PT needs can be met in the next venue of care  PT Problem List         PT Treatment Interventions      PT Goals (Current goals can be found in the Care Plan section)  Acute Rehab PT Goals PT Goal Formulation: All assessment and education complete, DC  therapy    Frequency     Barriers to discharge        Co-evaluation               AM-PAC PT "6 Clicks" Mobility  Outcome Measure Help needed turning from your back to your side while in a flat bed without using bedrails?: A Little Help needed moving from lying on your back to sitting on the side of a flat bed without using bedrails?: A Little Help needed moving to and from a bed to a chair (including a wheelchair)?: A Little Help needed standing up from a chair using your arms (e.g., wheelchair or bedside chair)?: A Little Help needed to walk in hospital room?: A Little Help needed climbing 3-5 steps with a railing? : A Little 6 Click Score: 18    End of Session Equipment Utilized During Treatment: Gait belt Activity Tolerance: Patient tolerated treatment well Patient left: in chair;with call bell/phone within reach Nurse Communication: Mobility status PT Visit Diagnosis: Difficulty in walking, not elsewhere classified (R26.2)    Time: 1020-1050 PT Time Calculation (min) (ACUTE ONLY): 30 min   Charges:   PT Evaluation $PT Eval Low Complexity: 1 Low PT Treatments $Gait Training: 8-22 mins        Baxter Flattery, PT   Acute Rehab Dept [facility]): [phone]   04/14/2019   [facility] 04/14/2019, 11:35 AM

## 2019-04-14 NOTE — Progress Notes (Signed)
     Subjective: 63 yo male, post op day 1 from left hip replacement. Patient seated up in bed. Reports pain to be mild. Ready to go home today.   Objective:   VITALS:   Vitals:   04/13/19 2133 04/13/19 2241 04/14/19 0005 04/14/19 0610  BP: (!) 158/72 (!) 144/78 137/66 127/64  Pulse: 60 78 68 (!) 53  Resp: 16 16 16 16   Temp: 98.7 F (37.1 C) 97.8 F (36.6 C) 98.1 F (36.7 C) 98.1 F (36.7 C)  TempSrc:  Oral Oral Oral  SpO2: 100% 100% 100% 98%  Weight:      Height:        Neurologically intact ABD soft Neurovascular intact Sensation intact distally Intact pulses distally Dorsiflexion/Plantar flexion intact Incision: dressing C/D/I   Lab Results  Component Value Date   WBC 7.7 04/14/2019   HGB 12.7 (L) 04/14/2019   HCT 40.4 04/14/2019   MCV 94.2 04/14/2019   PLT 238 04/14/2019   BMET    Component Value Date/Time   NA 140 04/14/2019 0304   K 4.0 04/14/2019 0304   CL 105 04/14/2019 0304   CO2 26 04/14/2019 0304   GLUCOSE 137 (H) 04/14/2019 0304   BUN 15 04/14/2019 0304   CREATININE 1.00 04/14/2019 0304   CALCIUM 9.1 04/14/2019 0304   GFRNONAA >60 04/14/2019 0304   GFRAA >60 04/14/2019 0304     Assessment/Plan: 1 Day Post-Op   Principal Problem:   Osteoarthritis of left hip Active Problems:   S/P hip replacement, left   Advance diet Up with therapy  WBAT  Dry Dressing PRN Plan to discharge today after PT evaluation.      Ventura Bruns 04/14/2019, 8:07 AM

## 2019-04-14 NOTE — Progress Notes (Signed)
Patient discharged to home w/ family. Given all belongings, instructions, equipment. Verbalized understanding of all equipment. Escorted to pov via w/c.

## 2019-04-14 NOTE — Discharge Summary (Signed)
Discharge Summary  Patient ID: David Reed MRN: [id number] DOB/AGE: [birth year] 63 y.o.  Admit date: 04/13/2019 Discharge date: 04/14/2019  Admission Diagnoses: Osteoarthritis of left hip  Discharge Diagnoses:  Principal Problem:   Osteoarthritis of left hip Active Problems:   S/P hip replacement, left  Past Medical History:  Diagnosis Date  . Allergy   . Arthritis   . DDD (degenerative disc disease), lumbar    no meds  . Hypertension    Past Surgical History:  Procedure Laterality Date  . ROTATOR CUFF REPAIR     right shoulder surgery   Discharged Condition: improved  Hospital Course: David Reed is a 63 yo male who was admitted on 04/13/19 with a diagnosis of left hip osteoarthritis and went to the operating room on 04/13/19 and underwent a left hip replacement.  He was given perioperative antibiotics. Antibiotics Given (last 72 hours)    Date/Time Action Medication Dose Rate   04/13/19 1349 Given   ceFAZolin (ANCEF) IVPB 2g/100 mL premix 2 g    04/13/19 2025 New Bag/Given   ceFAZolin (ANCEF) IVPB 2g/100 mL premix 2 g 200 mL/hr   04/14/19 0119 New Bag/Given   ceFAZolin (ANCEF) IVPB 2g/100 mL premix 2 g 200 mL/hr     He was given sequential compression devices, early ambulation, and aspirin for DVT prophylaxis. There were no complications.   Today's Vitals   04/13/19 2241 04/14/19 0005 04/14/19 0610 04/14/19 0940  BP: (!) 144/78 137/66 127/64 (!) 109/56  Pulse: 78 68 (!) 53 (!) 57  Resp: 16 16 16 16   Temp: 97.8 F (36.6 C) 98.1 F (36.7 C) 98.1 F (36.7 C) 98.4 F (36.9 C)  TempSrc: Oral Oral Oral   SpO2: 100% 100% 98% 98%  Weight:      Height:      PainSc:       Body mass index is 27.86 kg/m.  Consults: None  Disposition: Discharge disposition: 01-Home or Self Care        Allergies as of 04/14/2019   No Known Allergies     Medication List    TAKE these medications   albuterol 108 (90 Base) MCG/ACT inhaler Commonly known as: VENTOLIN  HFA Inhale 1-2 puffs into the lungs every 6 (six) hours as needed for wheezing or shortness of breath.   aspirin EC 325 MG tablet Take 1 tablet (325 mg total) by mouth 2 (two) times daily.   baclofen 10 MG tablet Commonly known as: LIORESAL Take 1 tablet (10 mg total) by mouth 3 (three) times daily. As needed for muscle spasm   cetirizine 10 MG tablet Commonly known as: ZYRTEC Take 10 mg by mouth daily.   DIGESTIVE ENZYME PO Take 2 capsules by mouth 2 (two) times daily. Primagest Digestive Enzymes Nutritional Supplement   FIBER PO Take 15 mLs by mouth daily. *Mix with water & lemon juice* Baltone Powder   hydrochlorothiazide 25 MG tablet Commonly known as: HYDRODIURIL Take 25 mg by mouth daily.   ibuprofen 200 MG tablet Commonly known as: ADVIL Take 400-600 mg by mouth 2 (two) times daily as needed (pain.).   losartan 100 MG tablet Commonly known as: COZAAR Take 100 mg by mouth daily.   multivitamin with minerals Tabs tablet Take 1 tablet by mouth daily.   NON FORMULARY Take 1-2 capsules by mouth 2 (two) times daily as needed (gas).   Omega 3 1200 MG Caps Take 2,400 mg by mouth daily.   ondansetron 4 MG tablet Commonly known as: Zofran  Take 1 tablet (4 mg total) by mouth every 8 (eight) hours as needed for nausea or vomiting.   oxyCODONE 5 MG immediate release tablet Commonly known as: Roxicodone Take 1 tablet (5 mg total) by mouth every 4 (four) hours as needed for severe pain.   Protease Powd Take 1 capsule by mouth daily. Protease Enzyme   sennosides-docusate sodium 8.6-50 MG tablet Commonly known as: SENOKOT-S Take 2 tablets by mouth daily.   vitamin C 1000 MG tablet Take 1,000 mg by mouth daily.      Follow-up Information    Marchia Bond, MD. Go on 04/26/2019.   Specialty: Orthopedic Surgery Why: Your appointment has been scheduled for 10:30  Contact information: [address] [place] [address] [phone]         [facility] Follow up.   Why: You will be seen by HHPT at home for 5 visits prior to starting outpatient physical therapy        [facility] Go on 04/26/2019.   Why: You will start outpatient physical therapy following your MD appointment on 04/26/19. Office will call you with a time  Contact information: [facility] [place] [address] [phone]           Signed: Ventura Bruns 04/14/2019, 11:05 AM

## 2019-04-15 DIAGNOSIS — Z96642 Presence of left artificial hip joint: Secondary | ICD-10-CM | POA: Diagnosis not present

## 2019-04-15 DIAGNOSIS — Z471 Aftercare following joint replacement surgery: Secondary | ICD-10-CM | POA: Diagnosis not present

## 2019-04-15 DIAGNOSIS — I10 Essential (primary) hypertension: Secondary | ICD-10-CM | POA: Diagnosis not present

## 2019-04-15 DIAGNOSIS — Z7982 Long term (current) use of aspirin: Secondary | ICD-10-CM | POA: Diagnosis not present

## 2019-04-15 DIAGNOSIS — M5136 Other intervertebral disc degeneration, lumbar region: Secondary | ICD-10-CM | POA: Diagnosis not present

## 2019-04-17 DIAGNOSIS — Z7982 Long term (current) use of aspirin: Secondary | ICD-10-CM | POA: Diagnosis not present

## 2019-04-17 DIAGNOSIS — Z471 Aftercare following joint replacement surgery: Secondary | ICD-10-CM | POA: Diagnosis not present

## 2019-04-17 DIAGNOSIS — I10 Essential (primary) hypertension: Secondary | ICD-10-CM | POA: Diagnosis not present

## 2019-04-17 DIAGNOSIS — Z96642 Presence of left artificial hip joint: Secondary | ICD-10-CM | POA: Diagnosis not present

## 2019-04-17 DIAGNOSIS — M5136 Other intervertebral disc degeneration, lumbar region: Secondary | ICD-10-CM | POA: Diagnosis not present

## 2019-04-19 DIAGNOSIS — Z7982 Long term (current) use of aspirin: Secondary | ICD-10-CM | POA: Diagnosis not present

## 2019-04-19 DIAGNOSIS — Z471 Aftercare following joint replacement surgery: Secondary | ICD-10-CM | POA: Diagnosis not present

## 2019-04-19 DIAGNOSIS — M5136 Other intervertebral disc degeneration, lumbar region: Secondary | ICD-10-CM | POA: Diagnosis not present

## 2019-04-19 DIAGNOSIS — Z96642 Presence of left artificial hip joint: Secondary | ICD-10-CM | POA: Diagnosis not present

## 2019-04-19 DIAGNOSIS — I10 Essential (primary) hypertension: Secondary | ICD-10-CM | POA: Diagnosis not present

## 2019-04-21 DIAGNOSIS — Z96642 Presence of left artificial hip joint: Secondary | ICD-10-CM | POA: Diagnosis not present

## 2019-04-21 DIAGNOSIS — Z471 Aftercare following joint replacement surgery: Secondary | ICD-10-CM | POA: Diagnosis not present

## 2019-04-21 DIAGNOSIS — I10 Essential (primary) hypertension: Secondary | ICD-10-CM | POA: Diagnosis not present

## 2019-04-21 DIAGNOSIS — Z7982 Long term (current) use of aspirin: Secondary | ICD-10-CM | POA: Diagnosis not present

## 2019-04-21 DIAGNOSIS — M5136 Other intervertebral disc degeneration, lumbar region: Secondary | ICD-10-CM | POA: Diagnosis not present

## 2019-04-22 DIAGNOSIS — I10 Essential (primary) hypertension: Secondary | ICD-10-CM | POA: Diagnosis not present

## 2019-04-22 DIAGNOSIS — Z7982 Long term (current) use of aspirin: Secondary | ICD-10-CM | POA: Diagnosis not present

## 2019-04-22 DIAGNOSIS — M5136 Other intervertebral disc degeneration, lumbar region: Secondary | ICD-10-CM | POA: Diagnosis not present

## 2019-04-22 DIAGNOSIS — Z471 Aftercare following joint replacement surgery: Secondary | ICD-10-CM | POA: Diagnosis not present

## 2019-04-22 DIAGNOSIS — Z96642 Presence of left artificial hip joint: Secondary | ICD-10-CM | POA: Diagnosis not present

## 2019-04-26 DIAGNOSIS — M6281 Muscle weakness (generalized): Secondary | ICD-10-CM | POA: Diagnosis not present

## 2019-04-26 DIAGNOSIS — R262 Difficulty in walking, not elsewhere classified: Secondary | ICD-10-CM | POA: Diagnosis not present

## 2019-04-26 DIAGNOSIS — M25652 Stiffness of left hip, not elsewhere classified: Secondary | ICD-10-CM | POA: Diagnosis not present

## 2019-04-26 DIAGNOSIS — Z471 Aftercare following joint replacement surgery: Secondary | ICD-10-CM | POA: Diagnosis not present

## 2019-04-26 DIAGNOSIS — Z96642 Presence of left artificial hip joint: Secondary | ICD-10-CM | POA: Diagnosis not present

## 2019-04-29 DIAGNOSIS — M25652 Stiffness of left hip, not elsewhere classified: Secondary | ICD-10-CM | POA: Diagnosis not present

## 2019-04-29 DIAGNOSIS — Z471 Aftercare following joint replacement surgery: Secondary | ICD-10-CM | POA: Diagnosis not present

## 2019-04-29 DIAGNOSIS — M6281 Muscle weakness (generalized): Secondary | ICD-10-CM | POA: Diagnosis not present

## 2019-04-29 DIAGNOSIS — R262 Difficulty in walking, not elsewhere classified: Secondary | ICD-10-CM | POA: Diagnosis not present

## 2019-05-04 DIAGNOSIS — M6281 Muscle weakness (generalized): Secondary | ICD-10-CM | POA: Diagnosis not present

## 2019-05-04 DIAGNOSIS — M25652 Stiffness of left hip, not elsewhere classified: Secondary | ICD-10-CM | POA: Diagnosis not present

## 2019-05-04 DIAGNOSIS — R262 Difficulty in walking, not elsewhere classified: Secondary | ICD-10-CM | POA: Diagnosis not present

## 2019-05-06 DIAGNOSIS — M25652 Stiffness of left hip, not elsewhere classified: Secondary | ICD-10-CM | POA: Diagnosis not present

## 2019-05-06 DIAGNOSIS — M6281 Muscle weakness (generalized): Secondary | ICD-10-CM | POA: Diagnosis not present

## 2019-05-06 DIAGNOSIS — Z96642 Presence of left artificial hip joint: Secondary | ICD-10-CM | POA: Diagnosis not present

## 2019-05-06 DIAGNOSIS — R262 Difficulty in walking, not elsewhere classified: Secondary | ICD-10-CM | POA: Diagnosis not present

## 2019-05-11 DIAGNOSIS — Z471 Aftercare following joint replacement surgery: Secondary | ICD-10-CM | POA: Diagnosis not present

## 2019-05-11 DIAGNOSIS — R262 Difficulty in walking, not elsewhere classified: Secondary | ICD-10-CM | POA: Diagnosis not present

## 2019-05-11 DIAGNOSIS — M25652 Stiffness of left hip, not elsewhere classified: Secondary | ICD-10-CM | POA: Diagnosis not present

## 2019-05-11 DIAGNOSIS — M6281 Muscle weakness (generalized): Secondary | ICD-10-CM | POA: Diagnosis not present

## 2019-05-13 DIAGNOSIS — M25652 Stiffness of left hip, not elsewhere classified: Secondary | ICD-10-CM | POA: Diagnosis not present

## 2019-05-13 DIAGNOSIS — Z96642 Presence of left artificial hip joint: Secondary | ICD-10-CM | POA: Diagnosis not present

## 2019-05-13 DIAGNOSIS — M6281 Muscle weakness (generalized): Secondary | ICD-10-CM | POA: Diagnosis not present

## 2019-05-13 DIAGNOSIS — R262 Difficulty in walking, not elsewhere classified: Secondary | ICD-10-CM | POA: Diagnosis not present

## 2019-05-18 DIAGNOSIS — R262 Difficulty in walking, not elsewhere classified: Secondary | ICD-10-CM | POA: Diagnosis not present

## 2019-05-18 DIAGNOSIS — Z471 Aftercare following joint replacement surgery: Secondary | ICD-10-CM | POA: Diagnosis not present

## 2019-05-18 DIAGNOSIS — M25652 Stiffness of left hip, not elsewhere classified: Secondary | ICD-10-CM | POA: Diagnosis not present

## 2019-05-18 DIAGNOSIS — M6281 Muscle weakness (generalized): Secondary | ICD-10-CM | POA: Diagnosis not present

## 2019-05-20 DIAGNOSIS — R262 Difficulty in walking, not elsewhere classified: Secondary | ICD-10-CM | POA: Diagnosis not present

## 2019-05-20 DIAGNOSIS — Z96642 Presence of left artificial hip joint: Secondary | ICD-10-CM | POA: Diagnosis not present

## 2019-05-20 DIAGNOSIS — M6281 Muscle weakness (generalized): Secondary | ICD-10-CM | POA: Diagnosis not present

## 2019-05-20 DIAGNOSIS — M25652 Stiffness of left hip, not elsewhere classified: Secondary | ICD-10-CM | POA: Diagnosis not present

## 2019-05-24 DIAGNOSIS — Z96642 Presence of left artificial hip joint: Secondary | ICD-10-CM | POA: Diagnosis not present

## 2019-05-24 DIAGNOSIS — M545 Low back pain: Secondary | ICD-10-CM | POA: Diagnosis not present

## 2019-05-31 DIAGNOSIS — M545 Low back pain: Secondary | ICD-10-CM | POA: Diagnosis not present

## 2019-05-31 DIAGNOSIS — M5136 Other intervertebral disc degeneration, lumbar region: Secondary | ICD-10-CM | POA: Diagnosis not present

## 2019-05-31 DIAGNOSIS — M6281 Muscle weakness (generalized): Secondary | ICD-10-CM | POA: Diagnosis not present

## 2019-06-02 DIAGNOSIS — M1612 Unilateral primary osteoarthritis, left hip: Secondary | ICD-10-CM | POA: Diagnosis not present

## 2019-06-09 DIAGNOSIS — M6281 Muscle weakness (generalized): Secondary | ICD-10-CM | POA: Diagnosis not present

## 2019-06-09 DIAGNOSIS — M5136 Other intervertebral disc degeneration, lumbar region: Secondary | ICD-10-CM | POA: Diagnosis not present

## 2019-06-09 DIAGNOSIS — M545 Low back pain: Secondary | ICD-10-CM | POA: Diagnosis not present

## 2019-06-23 DIAGNOSIS — M545 Low back pain: Secondary | ICD-10-CM | POA: Diagnosis not present

## 2019-06-23 DIAGNOSIS — M6281 Muscle weakness (generalized): Secondary | ICD-10-CM | POA: Diagnosis not present

## 2019-06-23 DIAGNOSIS — M5136 Other intervertebral disc degeneration, lumbar region: Secondary | ICD-10-CM | POA: Diagnosis not present

## 2019-07-07 DIAGNOSIS — R69 Illness, unspecified: Secondary | ICD-10-CM | POA: Diagnosis not present

## 2019-08-04 DIAGNOSIS — I10 Essential (primary) hypertension: Secondary | ICD-10-CM | POA: Diagnosis not present

## 2019-08-04 DIAGNOSIS — Z1211 Encounter for screening for malignant neoplasm of colon: Secondary | ICD-10-CM | POA: Diagnosis not present

## 2019-08-04 DIAGNOSIS — B351 Tinea unguium: Secondary | ICD-10-CM | POA: Diagnosis not present

## 2019-08-04 DIAGNOSIS — E291 Testicular hypofunction: Secondary | ICD-10-CM | POA: Diagnosis not present

## 2019-08-04 DIAGNOSIS — E7800 Pure hypercholesterolemia, unspecified: Secondary | ICD-10-CM | POA: Diagnosis not present

## 2019-08-04 DIAGNOSIS — N529 Male erectile dysfunction, unspecified: Secondary | ICD-10-CM | POA: Diagnosis not present

## 2019-08-12 DIAGNOSIS — E7800 Pure hypercholesterolemia, unspecified: Secondary | ICD-10-CM | POA: Diagnosis not present

## 2019-08-12 DIAGNOSIS — E349 Endocrine disorder, unspecified: Secondary | ICD-10-CM | POA: Diagnosis not present

## 2019-08-12 DIAGNOSIS — I10 Essential (primary) hypertension: Secondary | ICD-10-CM | POA: Diagnosis not present

## 2019-08-12 DIAGNOSIS — N529 Male erectile dysfunction, unspecified: Secondary | ICD-10-CM | POA: Diagnosis not present

## 2019-08-23 DIAGNOSIS — M1611 Unilateral primary osteoarthritis, right hip: Secondary | ICD-10-CM | POA: Diagnosis not present

## 2019-08-25 DIAGNOSIS — M25551 Pain in right hip: Secondary | ICD-10-CM | POA: Diagnosis not present

## 2019-08-25 DIAGNOSIS — M6281 Muscle weakness (generalized): Secondary | ICD-10-CM | POA: Diagnosis not present

## 2019-09-09 DIAGNOSIS — I10 Essential (primary) hypertension: Secondary | ICD-10-CM | POA: Diagnosis not present

## 2019-10-29 DIAGNOSIS — M545 Low back pain: Secondary | ICD-10-CM | POA: Diagnosis not present

## 2019-11-22 DIAGNOSIS — M1611 Unilateral primary osteoarthritis, right hip: Secondary | ICD-10-CM | POA: Diagnosis not present

## 2019-12-07 DIAGNOSIS — M1611 Unilateral primary osteoarthritis, right hip: Secondary | ICD-10-CM | POA: Diagnosis present

## 2019-12-07 NOTE — Progress Notes (Addendum)
PCP - 09-09-19 lov Dr. Seward Carol Cardiologist - no  PPM/ICD -  Device Orders -  Rep Notified -   Chest x-ray -  EKG - 04-07-19 epic Stress Test -  ECHO -  Cardiac Cath -   Sleep Study -  CPAP -   Fasting Blood Sugar -  Checks Blood Sugar _____ times a day  Blood Thinner Instructions: Aspirin Instructions:  ERAS Protcol - PRE-SURGERY Ensure  COVID TEST- 8-20  Activity - can walk a flight of stairs without sob  Anesthesia review: HTN  Patient denies shortness of breath, fever, cough and chest pain at PAT appointment  none   All instructions explained to the patient, with a verbal understanding of the material. Patient agrees to go over the instructions while at home for a better understanding. Patient also instructed to self quarantine after being tested for COVID-19. The opportunity to ask questions was provided.

## 2019-12-07 NOTE — Patient Instructions (Addendum)
DUE TO COVID-19 ONLY ONE VISITOR IS ALLOWED TO COME WITH YOU AND STAY IN THE WAITING ROOM ONLY DURING PRE OP AND PROCEDURE DAY OF SURGERY. THE 1 VISITOR  MAY VISIT WITH YOU AFTER SURGERY IN YOUR PRIVATE ROOM DURING VISITING HOURS ONLY!  YOU NEED TO HAVE A COVID 19 TEST ON_ 8-20-21______ @_______ , THIS TEST MUST BE DONE BEFORE SURGERY,  COVID TESTING SITE [address] [place] [address], IT IS ON THE RIGHT GOING OUT [address] APPROXIMATELY  2 MINUTES PAST [employer] ON THE RIGHT. ONCE YOUR COVID TEST IS COMPLETED,  PLEASE BEGIN THE QUARANTINE INSTRUCTIONS AS OUTLINED IN YOUR HANDOUT.                David Reed  12/07/2019   Your procedure is scheduled on: 12-21-19   Report to [facility] Main  Entrance   Report to admitting at         10:00  AM     Call this number if you have problems the morning of surgery [phone]    Remember: NO SOLID FOOD AFTER MIDNIGHT THE NIGHT PRIOR TO SURGERY. NOTHING BY MOUTH EXCEPT CLEAR LIQUIDS UNTIL     0930 am  . PLEASE FINISH ENSURE DRINK PER SURGEON ORDER  WHICH NEEDS TO BE COMPLETED AT     0930 am then nothing by mouth .    CLEAR LIQUID DIET   Foods Allowed                                                                                  Foods Excluded  Black Coffee and tea, regular and decaf                                        liquids that you cannot  Plain Jell-O any favor except red or purple                                           see through such as: Fruit ices (not with fruit pulp)                                                            milk, soups, orange juice  Iced Popsicles                                                            All solid food Carbonated beverages, regular and diet  Cranberry, grape and apple juices Sports drinks like Gatorade Lightly seasoned clear broth or consume(fat free) Sugar, honey  syrup   _____________________________________________________________________    BRUSH YOUR TEETH MORNING OF SURGERY AND RINSE YOUR MOUTH OUT, NO CHEWING GUM CANDY OR MINTS.     Take these medicines the morning of surgery with A SIP OF WATER: zyrtec                                 You may not have any metal on your body including hair pins and              piercings  Do not wear jewelry,  lotions, powders or perfumes, deodorant                       Men may shave face and neck.   Do not bring valuables to the hospital. [place].  Contacts, dentures or bridgework may not be worn into surgery.      Patients discharged the day of surgery will not be allowed to drive home. IF YOU ARE HAVING SURGERY AND GOING HOME THE SAME DAY, YOU MUST HAVE AN ADULT TO DRIVE YOU HOME AND BE WITH YOU FOR 24 HOURS. YOU MAY GO HOME BY TAXI OR UBER OR ORTHERWISE, BUT AN ADULT MUST ACCOMPANY YOU HOME AND STAY WITH YOU FOR 24 HOURS.  Name and phone number of your driver:  Special Instructions: N/A              Please read over the following fact sheets you were given: _____________________________________________________________________             [facility] - Preparing for Surgery Before surgery, you can play an important role.  Because skin is not sterile, your skin needs to be as free of germs as possible.  You can reduce the number of germs on your skin by washing with CHG (chlorahexidine gluconate) soap before surgery.  CHG is an antiseptic cleaner which kills germs and bonds with the skin to continue killing germs even after washing. Please DO NOT use if you have an allergy to CHG or antibacterial soaps.  If your skin becomes reddened/irritated stop using the CHG and inform your nurse when you arrive at Short Stay. Do not shave (including legs and underarms) for at least 48 hours prior to the first CHG shower.  You may shave your face/neck. Please  follow these instructions carefully:  1.  Shower with CHG Soap the night before surgery and the  morning of Surgery.  2.  If you choose to wash your hair, wash your hair first as usual with your  normal  shampoo.  3.  After you shampoo, rinse your hair and body thoroughly to remove the  shampoo.                           4.  Use CHG as you would any other liquid soap.  You can apply chg directly  to the skin and wash                       Gently with a scrungie or clean washcloth.  5.  Apply the CHG Soap to your body ONLY FROM THE NECK  DOWN.   Do not use on face/ open                           Wound or open sores. Avoid contact with eyes, ears mouth and genitals (private parts).                       Wash face,  Genitals (private parts) with your normal soap.             6.  Wash thoroughly, paying special attention to the area where your surgery  will be performed.  7.  Thoroughly rinse your body with warm water from the neck down.  8.  DO NOT shower/wash with your normal soap after using and rinsing off  the CHG Soap.                9.  Pat yourself dry with a clean towel.            10.  Wear clean pajamas.            11.  Place clean sheets on your bed the night of your first shower and do not  sleep with pets. Day of Surgery : Do not apply any lotions/deodorants the morning of surgery.  Please wear clean clothes to the hospital/surgery center.  FAILURE TO FOLLOW THESE INSTRUCTIONS MAY RESULT IN THE CANCELLATION OF YOUR SURGERY PATIENT SIGNATURE_________________________________  NURSE SIGNATURE__________________________________  ________________________________________________________________________   David Reed  An incentive spirometer is a tool that can help keep your lungs clear and active. This tool measures how well you are filling your lungs with each breath. Taking long deep breaths may help reverse or decrease the chance of developing breathing (pulmonary) problems  (especially infection) following:  A long period of time when you are unable to move or be active. BEFORE THE PROCEDURE   If the spirometer includes an indicator to show your best effort, your nurse or respiratory therapist will set it to a desired goal.  If possible, sit up straight or lean slightly forward. Try not to slouch.  Hold the incentive spirometer in an upright position. INSTRUCTIONS FOR USE  1. Sit on the edge of your bed if possible, or sit up as far as you can in bed or on a chair. 2. Hold the incentive spirometer in an upright position. 3. Breathe out normally. 4. Place the mouthpiece in your mouth and seal your lips tightly around it. 5. Breathe in slowly and as deeply as possible, raising the piston or the ball toward the top of the column. 6. Hold your breath for 3-5 seconds or for as long as possible. Allow the piston or ball to fall to the bottom of the column. 7. Remove the mouthpiece from your mouth and breathe out normally. 8. Rest for a few seconds and repeat Steps 1 through 7 at least 10 times every 1-2 hours when you are awake. Take your time and take a few normal breaths between deep breaths. 9. The spirometer may include an indicator to show your best effort. Use the indicator as a goal to work toward during each repetition. 10. After each set of 10 deep breaths, practice coughing to be sure your lungs are clear. If you have an incision (the cut made at the time of surgery), support your incision when coughing by placing a pillow or rolled up towels firmly against it. Once you are able to  get out of bed, walk around indoors and cough well. You may stop using the incentive spirometer when instructed by your caregiver.  RISKS AND COMPLICATIONS  Take your time so you do not get dizzy or light-headed.  If you are in pain, you may need to take or ask for pain medication before doing incentive spirometry. It is harder to take a deep breath if you are having  pain. AFTER USE  Rest and breathe slowly and easily.  It can be helpful to keep track of a log of your progress. Your caregiver can provide you with a simple table to help with this. If you are using the spirometer at home, follow these instructions: [place] IF:   You are having difficultly using the spirometer.  You have trouble using the spirometer as often as instructed.  Your pain medication is not giving enough relief while using the spirometer.  You develop fever of 100.5 F (38.1 C) or higher. SEEK IMMEDIATE MEDICAL CARE IF:   You cough up bloody sputum that had not been present before.  You develop fever of 102 F (38.9 C) or greater.  You develop worsening pain at or near the incision site. MAKE SURE YOU:   Understand these instructions.  Will watch your condition.  Will get help right away if you are not doing well or get worse. Document Released: 08/26/2006 Document Revised: 07/08/2011 Document Reviewed: 10/27/2006 [facility] Patient Information 2014 [place], [place].   ________________________________________________________________________

## 2019-12-14 ENCOUNTER — Encounter (HOSPITAL_COMMUNITY): Payer: Self-pay

## 2019-12-14 ENCOUNTER — Other Ambulatory Visit: Payer: Self-pay

## 2019-12-14 ENCOUNTER — Encounter (HOSPITAL_COMMUNITY)
Admission: RE | Admit: 2019-12-14 | Discharge: 2019-12-14 | Disposition: A | Discharge status: Home / Self Care | Payer: Medicare HMO | Source: Ambulatory Visit | Attending: Orthopedic Surgery | Admitting: Orthopedic Surgery

## 2019-12-14 ENCOUNTER — Encounter (INDEPENDENT_AMBULATORY_CARE_PROVIDER_SITE_OTHER): Payer: Self-pay

## 2019-12-14 DIAGNOSIS — R69 Illness, unspecified: Secondary | ICD-10-CM | POA: Diagnosis not present

## 2019-12-14 DIAGNOSIS — Z01818 Encounter for other preprocedural examination: Secondary | ICD-10-CM | POA: Diagnosis not present

## 2019-12-14 LAB — SURGICAL PCR SCREEN
MRSA, PCR: NEGATIVE
Staphylococcus aureus: NEGATIVE

## 2019-12-14 LAB — BASIC METABOLIC PANEL
Anion gap: 7 (ref 5–15)
BUN: 20 mg/dL (ref 8–23)
CO2: 30 mmol/L (ref 22–32)
Calcium: 9.5 mg/dL (ref 8.9–10.3)
Chloride: 103 mmol/L (ref 98–111)
Creatinine, Ser: 1.02 mg/dL (ref 0.61–1.24)
GFR calc Af Amer: >60 mL/min (ref >60)
GFR calc non Af Amer: >60 mL/min (ref >60)
Glucose, Bld: 101 mg/dL — ABNORMAL HIGH (ref 70–99)
Potassium: 3.9 mmol/L (ref 3.5–5.1)
Sodium: 140 mmol/L (ref 135–145)

## 2019-12-14 LAB — CBC
HCT: 42.2 % (ref 39.0–52.0)
Hemoglobin: 13.6 g/dL (ref 13.0–17.0)
MCH: 30.2 pg (ref 26.0–34.0)
MCHC: 32.2 g/dL (ref 30.0–36.0)
MCV: 93.8 fL (ref 80.0–100.0)
Platelets: 260 10*3/uL (ref 150–400)
RBC: 4.5 MIL/uL (ref 4.22–5.81)
RDW: 14.4 % (ref 11.5–15.5)
WBC: 3.1 10*3/uL — ABNORMAL LOW (ref 4.0–10.5)
nRBC: 0 % (ref 0.0–0.2)

## 2019-12-17 ENCOUNTER — Other Ambulatory Visit (HOSPITAL_COMMUNITY)
Admission: RE | Admit: 2019-12-17 | Discharge: 2019-12-17 | Disposition: A | Discharge status: Home / Self Care | Payer: Medicare HMO | Source: Ambulatory Visit | Attending: Orthopedic Surgery | Admitting: Orthopedic Surgery

## 2019-12-17 DIAGNOSIS — Z20822 Contact with and (suspected) exposure to covid-19: Secondary | ICD-10-CM | POA: Diagnosis not present

## 2019-12-17 DIAGNOSIS — Z01812 Encounter for preprocedural laboratory examination: Secondary | ICD-10-CM | POA: Diagnosis not present

## 2019-12-17 LAB — SARS CORONAVIRUS 2 (TAT 6-24 HRS): SARS Coronavirus 2: NEGATIVE

## 2019-12-21 ENCOUNTER — Encounter (HOSPITAL_COMMUNITY): Payer: Self-pay | Admitting: Orthopedic Surgery

## 2019-12-21 ENCOUNTER — Encounter (HOSPITAL_COMMUNITY)
Admission: RE | Disposition: A | Discharge status: Home / Self Care | Payer: Self-pay | Source: Home / Self Care | Attending: Orthopedic Surgery

## 2019-12-21 ENCOUNTER — Ambulatory Visit (HOSPITAL_COMMUNITY): Payer: Medicare HMO | Admitting: Certified Registered Nurse Anesthetist

## 2019-12-21 ENCOUNTER — Observation Stay (HOSPITAL_COMMUNITY)
Admission: RE | Admit: 2019-12-21 | Discharge: 2019-12-22 | Disposition: A | Discharge status: Home / Self Care | Payer: Medicare HMO | Attending: Orthopedic Surgery | Admitting: Orthopedic Surgery

## 2019-12-21 ENCOUNTER — Ambulatory Visit (HOSPITAL_COMMUNITY): Payer: Medicare HMO

## 2019-12-21 ENCOUNTER — Other Ambulatory Visit: Payer: Self-pay

## 2019-12-21 DIAGNOSIS — Z96642 Presence of left artificial hip joint: Secondary | ICD-10-CM | POA: Insufficient documentation

## 2019-12-21 DIAGNOSIS — M1611 Unilateral primary osteoarthritis, right hip: Secondary | ICD-10-CM | POA: Diagnosis not present

## 2019-12-21 DIAGNOSIS — I10 Essential (primary) hypertension: Secondary | ICD-10-CM | POA: Insufficient documentation

## 2019-12-21 DIAGNOSIS — Z79899 Other long term (current) drug therapy: Secondary | ICD-10-CM | POA: Diagnosis not present

## 2019-12-21 DIAGNOSIS — Z96641 Presence of right artificial hip joint: Secondary | ICD-10-CM | POA: Diagnosis not present

## 2019-12-21 DIAGNOSIS — R600 Localized edema: Secondary | ICD-10-CM | POA: Diagnosis not present

## 2019-12-21 HISTORY — PX: TOTAL HIP ARTHROPLASTY: SHX124

## 2019-12-21 LAB — TYPE AND SCREEN
ABO/RH(D): A POS
Antibody Screen: NEGATIVE

## 2019-12-21 SURGERY — ARTHROPLASTY, HIP, TOTAL,POSTERIOR APPROACH
Anesthesia: Spinal | Site: Hip | Laterality: Right

## 2019-12-21 MED ORDER — ACETAMINOPHEN 500 MG PO TABS
1000.0000 mg | ORAL_TABLET | Freq: Once | ORAL | Status: AC
  Administered 2019-12-21: 1000 mg via ORAL
  Filled 2019-12-21: qty 2

## 2019-12-21 MED ORDER — LORATADINE 10 MG PO TABS
10.0000 mg | ORAL_TABLET | Freq: Every day | ORAL | Status: DC
  Administered 2019-12-22: 10 mg via ORAL
  Filled 2019-12-21: qty 1

## 2019-12-21 MED ORDER — OXYCODONE HCL 5 MG PO TABS: 10.0000 mg | ORAL_TABLET | ORAL | Status: DC | PRN

## 2019-12-21 MED ORDER — OXYCODONE HCL 5 MG PO TABS
5.0000 mg | ORAL_TABLET | ORAL | Status: DC | PRN
  Administered 2019-12-21: 5 mg via ORAL
  Administered 2019-12-22: 10 mg via ORAL
  Filled 2019-12-21: qty 1
  Filled 2019-12-21: qty 2

## 2019-12-21 MED ORDER — BISACODYL 10 MG RE SUPP: 10.0000 mg | Freq: Every day | RECTAL | Status: DC | PRN

## 2019-12-21 MED ORDER — KETOROLAC TROMETHAMINE 30 MG/ML IJ SOLN
INTRAMUSCULAR | Status: AC
  Filled 2019-12-21: qty 1

## 2019-12-21 MED ORDER — CHLORHEXIDINE GLUCONATE 0.12 % MT SOLN
15.0000 mL | Freq: Once | OROMUCOSAL | Status: AC
  Administered 2019-12-21: 15 mL via OROMUCOSAL

## 2019-12-21 MED ORDER — BUPIVACAINE-EPINEPHRINE (PF) 0.25% -1:200000 IJ SOLN
INTRAMUSCULAR | Status: AC
  Filled 2019-12-21: qty 30

## 2019-12-21 MED ORDER — CEFAZOLIN SODIUM-DEXTROSE 2-4 GM/100ML-% IV SOLN
2.0000 g | INTRAVENOUS | Status: AC
  Administered 2019-12-21: 2 g via INTRAVENOUS
  Filled 2019-12-21: qty 100

## 2019-12-21 MED ORDER — ACETAMINOPHEN 10 MG/ML IV SOLN: 1000.0000 mg | Freq: Once | INTRAVENOUS | Status: DC | PRN

## 2019-12-21 MED ORDER — ACETAMINOPHEN 500 MG PO TABS
1000.0000 mg | ORAL_TABLET | Freq: Four times a day (QID) | ORAL | Status: DC
  Administered 2019-12-21 – 2019-12-22 (×3): 1000 mg via ORAL
  Filled 2019-12-21 (×3): qty 2

## 2019-12-21 MED ORDER — MIDAZOLAM HCL 5 MG/5ML IJ SOLN
INTRAMUSCULAR | Status: DC | PRN
  Administered 2019-12-21: 2 mg via INTRAVENOUS

## 2019-12-21 MED ORDER — PROPOFOL 10 MG/ML IV BOLUS
INTRAVENOUS | Status: AC
  Filled 2019-12-21: qty 40

## 2019-12-21 MED ORDER — ZOLPIDEM TARTRATE 5 MG PO TABS: 5.0000 mg | ORAL_TABLET | Freq: Every evening | ORAL | Status: DC | PRN

## 2019-12-21 MED ORDER — KETOROLAC TROMETHAMINE 30 MG/ML IJ SOLN
INTRAMUSCULAR | Status: DC | PRN
  Administered 2019-12-21: 30 mg

## 2019-12-21 MED ORDER — HYDROMORPHONE HCL 1 MG/ML IJ SOLN
0.2500 mg | INTRAMUSCULAR | Status: DC | PRN
  Administered 2019-12-21: 0.5 mg via INTRAVENOUS

## 2019-12-21 MED ORDER — KETOROLAC TROMETHAMINE 15 MG/ML IJ SOLN
7.5000 mg | Freq: Four times a day (QID) | INTRAMUSCULAR | Status: DC
  Administered 2019-12-21 – 2019-12-22 (×2): 7.5 mg via INTRAVENOUS
  Filled 2019-12-21 (×2): qty 1

## 2019-12-21 MED ORDER — BUPIVACAINE HCL (PF) 0.25 % IJ SOLN
INTRAMUSCULAR | Status: AC
  Filled 2019-12-21: qty 30

## 2019-12-21 MED ORDER — MAGNESIUM CITRATE PO SOLN: 1.0000 | Freq: Once | ORAL | Status: DC | PRN

## 2019-12-21 MED ORDER — PHENYLEPHRINE 40 MCG/ML (10ML) SYRINGE FOR IV PUSH (FOR BLOOD PRESSURE SUPPORT)
PREFILLED_SYRINGE | INTRAVENOUS | Status: AC
  Filled 2019-12-21: qty 10

## 2019-12-21 MED ORDER — ACETAMINOPHEN 325 MG PO TABS: 325.0000 mg | ORAL_TABLET | Freq: Once | ORAL | Status: DC | PRN

## 2019-12-21 MED ORDER — OMEGA-3-ACID ETHYL ESTERS 1 G PO CAPS
1.0000 g | ORAL_CAPSULE | Freq: Every day | ORAL | Status: DC
  Administered 2019-12-21 – 2019-12-22 (×2): 1 g via ORAL
  Filled 2019-12-21 (×2): qty 1

## 2019-12-21 MED ORDER — HYDROCHLOROTHIAZIDE 25 MG PO TABS
100.0000 mg | ORAL_TABLET | Freq: Every day | ORAL | Status: DC
  Filled 2019-12-21: qty 4

## 2019-12-21 MED ORDER — PROPOFOL 1000 MG/100ML IV EMUL
INTRAVENOUS | Status: AC
  Filled 2019-12-21: qty 100

## 2019-12-21 MED ORDER — MIDAZOLAM HCL 2 MG/2ML IJ SOLN
INTRAMUSCULAR | Status: AC
  Filled 2019-12-21: qty 2

## 2019-12-21 MED ORDER — ACETAMINOPHEN 160 MG/5ML PO SOLN: 325.0000 mg | Freq: Once | ORAL | Status: DC | PRN

## 2019-12-21 MED ORDER — ACETAMINOPHEN 325 MG PO TABS: 325.0000 mg | ORAL_TABLET | Freq: Four times a day (QID) | ORAL | Status: DC | PRN

## 2019-12-21 MED ORDER — TRANEXAMIC ACID-NACL 1000-0.7 MG/100ML-% IV SOLN
1000.0000 mg | INTRAVENOUS | Status: AC
  Administered 2019-12-21: 1000 mg via INTRAVENOUS
  Filled 2019-12-21: qty 100

## 2019-12-21 MED ORDER — POLYETHYLENE GLYCOL 3350 17 G PO PACK: 17.0000 g | PACK | Freq: Every day | ORAL | Status: DC | PRN

## 2019-12-21 MED ORDER — MEPERIDINE HCL 50 MG/ML IJ SOLN: 6.2500 mg | INTRAMUSCULAR | Status: DC | PRN

## 2019-12-21 MED ORDER — METOCLOPRAMIDE HCL 5 MG/ML IJ SOLN: 5.0000 mg | Freq: Three times a day (TID) | INTRAMUSCULAR | Status: DC | PRN

## 2019-12-21 MED ORDER — POVIDONE-IODINE 10 % EX SWAB
2.0000 "application " | Freq: Once | CUTANEOUS | Status: AC
  Administered 2019-12-21: 2 via TOPICAL

## 2019-12-21 MED ORDER — DEXAMETHASONE SODIUM PHOSPHATE 10 MG/ML IJ SOLN
INTRAMUSCULAR | Status: DC | PRN
  Administered 2019-12-21: 8 mg via INTRAVENOUS

## 2019-12-21 MED ORDER — PROPOFOL 10 MG/ML IV BOLUS
INTRAVENOUS | Status: DC | PRN
  Administered 2019-12-21: 30 mg via INTRAVENOUS

## 2019-12-21 MED ORDER — MENTHOL 3 MG MT LOZG: 1.0000 | LOZENGE | OROMUCOSAL | Status: DC | PRN

## 2019-12-21 MED ORDER — LACTATED RINGERS IV SOLN: INTRAVENOUS | Status: DC

## 2019-12-21 MED ORDER — PROPOFOL 500 MG/50ML IV EMUL
INTRAVENOUS | Status: AC
  Filled 2019-12-21: qty 50

## 2019-12-21 MED ORDER — DEXAMETHASONE SODIUM PHOSPHATE 10 MG/ML IJ SOLN
INTRAMUSCULAR | Status: AC
  Filled 2019-12-21: qty 1

## 2019-12-21 MED ORDER — ONDANSETRON HCL 4 MG/2ML IJ SOLN
INTRAMUSCULAR | Status: DC | PRN
  Administered 2019-12-21: 4 mg via INTRAVENOUS

## 2019-12-21 MED ORDER — HYDROMORPHONE HCL 1 MG/ML IJ SOLN: 0.5000 mg | INTRAMUSCULAR | Status: DC | PRN

## 2019-12-21 MED ORDER — OMEGA 3 1200 MG PO CAPS
1200.0000 mg | ORAL_CAPSULE | Freq: Every day | ORAL | Status: DC
  Filled 2019-12-21: qty 1

## 2019-12-21 MED ORDER — BUPIVACAINE IN DEXTROSE 0.75-8.25 % IT SOLN
INTRATHECAL | Status: DC | PRN
  Administered 2019-12-21: 2 mL via INTRATHECAL

## 2019-12-21 MED ORDER — ASCORBIC ACID 500 MG PO TABS
500.0000 mg | ORAL_TABLET | Freq: Every day | ORAL | Status: DC
  Administered 2019-12-21 – 2019-12-22 (×2): 500 mg via ORAL
  Filled 2019-12-21 (×2): qty 1

## 2019-12-21 MED ORDER — METOCLOPRAMIDE HCL 5 MG PO TABS: 5.0000 mg | ORAL_TABLET | Freq: Three times a day (TID) | ORAL | Status: DC | PRN

## 2019-12-21 MED ORDER — CEFAZOLIN SODIUM-DEXTROSE 2-4 GM/100ML-% IV SOLN
2.0000 g | Freq: Four times a day (QID) | INTRAVENOUS | Status: AC
  Administered 2019-12-21 – 2019-12-22 (×2): 2 g via INTRAVENOUS
  Filled 2019-12-21 (×2): qty 100

## 2019-12-21 MED ORDER — ONDANSETRON HCL 4 MG/2ML IJ SOLN: 4.0000 mg | Freq: Four times a day (QID) | INTRAMUSCULAR | Status: DC | PRN

## 2019-12-21 MED ORDER — DIPHENHYDRAMINE HCL 12.5 MG/5ML PO ELIX: 12.5000 mg | ORAL_SOLUTION | ORAL | Status: DC | PRN

## 2019-12-21 MED ORDER — ORAL CARE MOUTH RINSE: 15.0000 mL | Freq: Once | OROMUCOSAL | Status: AC

## 2019-12-21 MED ORDER — VASOPRESSIN 20 UNIT/ML IV SOLN
INTRAVENOUS | Status: AC
  Filled 2019-12-21: qty 1

## 2019-12-21 MED ORDER — POTASSIUM CHLORIDE IN NACL 20-0.45 MEQ/L-% IV SOLN
INTRAVENOUS | Status: DC
  Filled 2019-12-21 (×2): qty 1000

## 2019-12-21 MED ORDER — DOCUSATE SODIUM 100 MG PO CAPS
100.0000 mg | ORAL_CAPSULE | Freq: Two times a day (BID) | ORAL | Status: DC
  Administered 2019-12-21 – 2019-12-22 (×2): 100 mg via ORAL
  Filled 2019-12-21 (×2): qty 1

## 2019-12-21 MED ORDER — CHROMIUM 1000 MCG PO TABS: 1000.0000 ug | ORAL_TABLET | Freq: Every day | ORAL | Status: DC

## 2019-12-21 MED ORDER — PHENYLEPHRINE HCL-NACL 10-0.9 MG/250ML-% IV SOLN
INTRAVENOUS | Status: DC | PRN
  Administered 2019-12-21: 75 ug/min via INTRAVENOUS

## 2019-12-21 MED ORDER — HYDROMORPHONE HCL 1 MG/ML IJ SOLN
INTRAMUSCULAR | Status: AC
Start: 2019-12-21 — End: 2019-12-22
  Filled 2019-12-21: qty 1

## 2019-12-21 MED ORDER — BUPIVACAINE HCL (PF) 0.25 % IJ SOLN
INTRAMUSCULAR | Status: DC | PRN
  Administered 2019-12-21: 30 mL

## 2019-12-21 MED ORDER — EPHEDRINE SULFATE-NACL 50-0.9 MG/10ML-% IV SOSY
PREFILLED_SYRINGE | INTRAVENOUS | Status: DC | PRN
  Administered 2019-12-21 (×2): 20 mg via INTRAVENOUS
  Administered 2019-12-21 (×2): 10 mg via INTRAVENOUS

## 2019-12-21 MED ORDER — AMISULPRIDE (ANTIEMETIC) 5 MG/2ML IV SOLN: 10.0000 mg | Freq: Once | INTRAVENOUS | Status: DC | PRN

## 2019-12-21 MED ORDER — HYDROGEN PEROXIDE 3 % EX SOLN
CUTANEOUS | Status: AC
  Filled 2019-12-21: qty 473

## 2019-12-21 MED ORDER — PHENYLEPHRINE 40 MCG/ML (10ML) SYRINGE FOR IV PUSH (FOR BLOOD PRESSURE SUPPORT)
PREFILLED_SYRINGE | INTRAVENOUS | Status: DC | PRN
  Administered 2019-12-21: 80 ug via INTRAVENOUS

## 2019-12-21 MED ORDER — ASPIRIN EC 325 MG PO TBEC
325.0000 mg | DELAYED_RELEASE_TABLET | Freq: Every day | ORAL | Status: DC
  Administered 2019-12-22: 325 mg via ORAL
  Filled 2019-12-21: qty 1

## 2019-12-21 MED ORDER — EPHEDRINE 5 MG/ML INJ
INTRAVENOUS | Status: AC
  Filled 2019-12-21: qty 30

## 2019-12-21 MED ORDER — STERILE WATER FOR IRRIGATION IR SOLN
Status: DC | PRN
  Administered 2019-12-21: 1000 mL

## 2019-12-21 MED ORDER — ONDANSETRON HCL 4 MG PO TABS: 4.0000 mg | ORAL_TABLET | Freq: Four times a day (QID) | ORAL | Status: DC | PRN

## 2019-12-21 MED ORDER — FENTANYL CITRATE (PF) 100 MCG/2ML IJ SOLN
INTRAMUSCULAR | Status: DC | PRN
Start: 2019-12-21 — End: 2019-12-21
  Administered 2019-12-21: 100 ug via INTRAVENOUS

## 2019-12-21 MED ORDER — ONDANSETRON HCL 4 MG/2ML IJ SOLN
INTRAMUSCULAR | Status: AC
  Filled 2019-12-21: qty 2

## 2019-12-21 MED ORDER — FENTANYL CITRATE (PF) 100 MCG/2ML IJ SOLN
INTRAMUSCULAR | Status: AC
  Filled 2019-12-21: qty 2

## 2019-12-21 MED ORDER — 0.9 % SODIUM CHLORIDE (POUR BTL) OPTIME
TOPICAL | Status: DC | PRN
  Administered 2019-12-21: 1000 mL

## 2019-12-21 MED ORDER — PHENOL 1.4 % MT LIQD: 1.0000 | OROMUCOSAL | Status: DC | PRN

## 2019-12-21 MED ORDER — ALUM & MAG HYDROXIDE-SIMETH 200-200-20 MG/5ML PO SUSP: 30.0000 mL | ORAL | Status: DC | PRN

## 2019-12-21 MED ORDER — LOSARTAN POTASSIUM 50 MG PO TABS
100.0000 mg | ORAL_TABLET | Freq: Every day | ORAL | Status: DC
  Administered 2019-12-21 – 2019-12-22 (×2): 100 mg via ORAL
  Filled 2019-12-21 (×2): qty 2

## 2019-12-21 MED ORDER — PROPOFOL 500 MG/50ML IV EMUL
INTRAVENOUS | Status: DC | PRN
  Administered 2019-12-21: 100 ug/kg/min via INTRAVENOUS
  Administered 2019-12-21: 80 ug/kg/min via INTRAVENOUS

## 2019-12-21 SURGICAL SUPPLY — 61 items
BIT DRILL 2.0X128 (BIT) ×2 IMPLANT
BIT DRILL 2.0X128MM (BIT) ×1
BLADE SAW SAG 73X25 THK (BLADE) ×2
BLADE SAW SGTL 73X25 THK (BLADE) ×1 IMPLANT
CLOSURE STERI-STRIP 1/2X4 (GAUZE/BANDAGES/DRESSINGS) ×1
CLSR STERI-STRIP ANTIMIC 1/2X4 (GAUZE/BANDAGES/DRESSINGS) ×3 IMPLANT
COVER SURGICAL LIGHT HANDLE (MISCELLANEOUS) ×3 IMPLANT
COVER WAND RF STERILE (DRAPES) IMPLANT
CUP ACET PNNCL SECTR W/GRIP 56 (Hips) IMPLANT
DRAPE INCISE IOBAN 66X45 STRL (DRAPES) ×3 IMPLANT
DRAPE ORTHO SPLIT 77X108 STRL (DRAPES) ×6
DRAPE POUCH INSTRU U-SHP 10X18 (DRAPES) ×3 IMPLANT
DRAPE SHEET LG 3/4 BI-LAMINATE (DRAPES) ×3 IMPLANT
DRAPE SURG 17X11 SM STRL (DRAPES) ×3 IMPLANT
DRAPE SURG ORHT 6 SPLT 77X108 (DRAPES) ×2 IMPLANT
DRAPE U-SHAPE 47X51 STRL (DRAPES) ×3 IMPLANT
DRSG MEPILEX BORDER 4X8 (GAUZE/BANDAGES/DRESSINGS) ×3 IMPLANT
DRSG MEPILEX SACRM 8.7X9.8 (GAUZE/BANDAGES/DRESSINGS) ×2 IMPLANT
DURAPREP 26ML APPLICATOR (WOUND CARE) ×6 IMPLANT
ELECT BLADE TIP CTD 4 INCH (ELECTRODE) ×3 IMPLANT
ELECT REM PT RETURN 15FT ADLT (MISCELLANEOUS) ×3 IMPLANT
ELIMINATOR HOLE APEX DEPUY (Hips) ×2 IMPLANT
FACESHIELD WRAPAROUND (MASK) ×3 IMPLANT
FACESHIELD WRAPAROUND OR TEAM (MASK) ×1 IMPLANT
GLOVE BIO SURGEON STRL SZ7 (GLOVE) ×3 IMPLANT
GLOVE BIO SURGEON STRL SZ7.5 (GLOVE) ×3 IMPLANT
GLOVE BIOGEL PI IND STRL 7.0 (GLOVE) ×1 IMPLANT
GLOVE BIOGEL PI IND STRL 8 (GLOVE) ×1 IMPLANT
GLOVE BIOGEL PI INDICATOR 7.0 (GLOVE) ×2
GLOVE BIOGEL PI INDICATOR 8 (GLOVE) ×2
GOWN STRL REUS W/TWL LRG LVL3 (GOWN DISPOSABLE) ×6 IMPLANT
HEAD CERAMIC DELTA 36 PLUS 1.5 (Hips) ×2 IMPLANT
HOOD PEEL AWAY FLYTE STAYCOOL (MISCELLANEOUS) ×9 IMPLANT
KIT BASIN OR (CUSTOM PROCEDURE TRAY) ×3 IMPLANT
KIT TURNOVER KIT A (KITS) ×3 IMPLANT
MANIFOLD NEPTUNE II (INSTRUMENTS) ×3 IMPLANT
NDL SAFETY ECLIPSE 18X1.5 (NEEDLE) ×2 IMPLANT
NEEDLE HYPO 18GX1.5 SHARP (NEEDLE) ×6
NS IRRIG 1000ML POUR BTL (IV SOLUTION) ×3 IMPLANT
PACK TOTAL JOINT (CUSTOM PROCEDURE TRAY) ×3 IMPLANT
PENCIL SMOKE EVACUATOR (MISCELLANEOUS) IMPLANT
PINN SECTOR W/GRIP ACE CUP 56 (Hips) ×3 IMPLANT
PINNACLE ALTRX PLUS 4 N 36X56 (Hips) ×2 IMPLANT
PROTECTOR NERVE ULNAR (MISCELLANEOUS) ×3 IMPLANT
RETRIEVER SUT HEWSON (MISCELLANEOUS) ×3 IMPLANT
SCREW 6.5MMX25MM (Screw) ×2 IMPLANT
STEM SUM DUOFIX TAP 7TH OFFSET (Hips) ×2 IMPLANT
SUCTION FRAZIER HANDLE 12FR (TUBING) ×3
SUCTION TUBE FRAZIER 12FR DISP (TUBING) ×1 IMPLANT
SUT FIBERWIRE #2 38 REV NDL BL (SUTURE) ×9
SUT VIC AB 0 CT1 36 (SUTURE) ×3 IMPLANT
SUT VIC AB 1 CT1 36 (SUTURE) ×6 IMPLANT
SUT VIC AB 2-0 CT1 27 (SUTURE) ×6
SUT VIC AB 2-0 CT1 TAPERPNT 27 (SUTURE) ×2 IMPLANT
SUT VIC AB 3-0 SH 8-18 (SUTURE) ×3 IMPLANT
SUTURE FIBERWR#2 38 REV NDL BL (SUTURE) ×3 IMPLANT
SYR CONTROL 10ML LL (SYRINGE) ×6 IMPLANT
TOWEL OR 17X26 10 PK STRL BLUE (TOWEL DISPOSABLE) ×3 IMPLANT
TRAY FOLEY MTR SLVR 16FR STAT (SET/KITS/TRAYS/PACK) ×3 IMPLANT
WATER STERILE IRR 1000ML POUR (IV SOLUTION) ×6 IMPLANT
YANKAUER SUCT BULB TIP 10FT TU (MISCELLANEOUS) ×3 IMPLANT

## 2019-12-21 NOTE — Discharge Instructions (Signed)
INSTRUCTIONS AFTER JOINT REPLACEMENT   o Remove items at home which could result in a fall. This includes throw rugs or furniture in walking pathways o ICE to the affected joint every three hours while awake for 30 minutes at a time, for at least the first 3-5 days, and then as needed for pain and swelling.  Continue to use ice for pain and swelling. You may notice swelling that will progress down to the foot and ankle.  This is normal after surgery.  Elevate your leg when you are not up walking on it.   o Continue to use the breathing machine you got in the hospital (incentive spirometer) which will help keep your temperature down.  It is common for your temperature to cycle up and down following surgery, especially at night when you are not up moving around and exerting yourself.  The breathing machine keeps your lungs expanded and your temperature down.   DIET:  As you were doing prior to hospitalization, we recommend a well-balanced diet.  DRESSING / WOUND CARE / SHOWERING  You may change your dressing 3-5 days after surgery.  Then change the dressing every day with sterile gauze.  Please use good hand washing techniques before changing the dressing.  Do not use any lotions or creams on the incision until instructed by your surgeon.  ACTIVITY  o Increase activity slowly as tolerated, but follow the weight bearing instructions below.   o No driving for 6 weeks or until further direction given by your physician.  You cannot drive while taking narcotics.  o No lifting or carrying greater than 10 lbs. until further directed by your surgeon. o Avoid periods of inactivity such as sitting longer than an hour when not asleep. This helps prevent blood clots.  o You may return to work once you are authorized by your doctor.     WEIGHT BEARING   Weight bearing as tolerated with assist device (walker, cane, etc) as directed, use it as long as suggested by your surgeon or therapist, typically at  least 4-6 weeks.   EXERCISES  Results after joint replacement surgery are often greatly improved when you follow the exercise, range of motion and muscle strengthening exercises prescribed by your doctor. Safety measures are also important to protect the joint from further injury. Any time any of these exercises cause you to have increased pain or swelling, decrease what you are doing until you are comfortable again and then slowly increase them. If you have problems or questions, call your caregiver or physical therapist for advice.   Rehabilitation is important following a joint replacement. After just a few days of immobilization, the muscles of the leg can become weakened and shrink (atrophy).  These exercises are designed to build up the tone and strength of the thigh and leg muscles and to improve motion. Often times heat used for twenty to thirty minutes before working out will loosen up your tissues and help with improving the range of motion but do not use heat for the first two weeks following surgery (sometimes heat can increase post-operative swelling).   These exercises can be done on a training (exercise) mat, on the floor, on a table or on a bed. Use whatever works the best and is most comfortable for you.    Use music or television while you are exercising so that the exercises are a pleasant break in your day. This will make your life better with the exercises acting as a break   in your routine that you can look forward to.   Perform all exercises about fifteen times, three times per day or as directed.  You should exercise both the operative leg and the other leg as well.  Exercises include:   . Quad Sets - Tighten up the muscle on the front of the thigh (Quad) and hold for 5-10 seconds.   . Straight Leg Raises - With your knee straight (if you were given a brace, keep it on), lift the leg to 60 degrees, hold for 3 seconds, and slowly lower the leg.  Perform this exercise against  resistance later as your leg gets stronger.  . Leg Slides: Lying on your back, slowly slide your foot toward your buttocks, bending your knee up off the floor (only go as far as is comfortable). Then slowly slide your foot back down until your leg is flat on the floor again.  . Angel Wings: Lying on your back spread your legs to the side as far apart as you can without causing discomfort.  . Hamstring Strength:  Lying on your back, push your heel against the floor with your leg straight by tightening up the muscles of your buttocks.  Repeat, but this time bend your knee to a comfortable angle, and push your heel against the floor.  You may put a pillow under the heel to make it more comfortable if necessary.   A rehabilitation program following joint replacement surgery can speed recovery and prevent re-injury in the future due to weakened muscles. Contact your doctor or a physical therapist for more information on knee rehabilitation.    CONSTIPATION  Constipation is defined medically as fewer than three stools per week and severe constipation as less than one stool per week.  Even if you have a regular bowel pattern at home, your normal regimen is likely to be disrupted due to multiple reasons following surgery.  Combination of anesthesia, postoperative narcotics, change in appetite and fluid intake all can affect your bowels.   YOU MUST use at least one of the following options; they are listed in order of increasing strength to get the job done.  They are all available over the counter, and you may need to use some, POSSIBLY even all of these options:    Drink plenty of fluids (prune juice may be helpful) and high fiber foods Colace 100 mg by mouth twice a day  Senokot for constipation as directed and as needed Dulcolax (bisacodyl), take with full glass of water  Miralax (polyethylene glycol) once or twice a day as needed.  If you have tried all these things and are unable to have a bowel  movement in the first 3-4 days after surgery call either your surgeon or your primary doctor.    If you experience loose stools or diarrhea, hold the medications until you stool forms back up.  If your symptoms do not get better within 1 week or if they get worse, check with your doctor.  If you experience "the worst abdominal pain ever" or develop nausea or vomiting, please contact the office immediately for further recommendations for treatment.   ITCHING:  If you experience itching with your medications, try taking only a single pain pill, or even half a pain pill at a time.  You can also use Benadryl over the counter for itching or also to help with sleep.   TED HOSE STOCKINGS:  Use stockings on both legs until for at least 2 weeks or as   directed by physician office. They may be removed at night for sleeping.  MEDICATIONS:  See your medication summary on the "After Visit Summary" that nursing will review with you.  You may have some home medications which will be placed on hold until you complete the course of blood thinner medication.  It is important for you to complete the blood thinner medication as prescribed.  PRECAUTIONS:  If you experience chest pain or shortness of breath - call 911 immediately for transfer to the hospital emergency department.   If you develop a fever greater that 101 F, purulent drainage from wound, increased redness or drainage from wound, foul odor from the wound/dressing, or calf pain - CONTACT YOUR SURGEON.                                                   FOLLOW-UP APPOINTMENTS:  If you do not already have a post-op appointment, please call the office for an appointment to be seen by your surgeon.  Guidelines for how soon to be seen are listed in your "After Visit Summary", but are typically between 1-4 weeks after surgery.  OTHER INSTRUCTIONS:   Knee Replacement:  Do not place pillow under knee, focus on keeping the knee straight while resting. CPM  instructions: 0-90 degrees, 2 hours in the morning, 2 hours in the afternoon, and 2 hours in the evening. Place foam block, curve side up under heel at all times except when in CPM or when walking.  DO NOT modify, tear, cut, or change the foam block in any way.   DENTAL ANTIBIOTICS:  In most cases prophylactic antibiotics for Dental procdeures after total joint surgery are not necessary.  Exceptions are as follows:  1. History of prior total joint infection  2. Severely immunocompromised (Organ Transplant, cancer chemotherapy, Rheumatoid biologic meds such as Humera)  3. Poorly controlled diabetes (A1C &gt; 8.0, blood glucose over 200)  If you have one of these conditions, contact your surgeon for an antibiotic prescription, prior to your dental procedure.   MAKE SURE YOU:  . Understand these instructions.  . Get help right away if you are not doing well or get worse.    Thank you for letting us be a part of your medical care team.  It is a privilege we respect greatly.  We hope these instructions will help you stay on track for a fast and full recovery!   

## 2019-12-21 NOTE — Progress Notes (Signed)
PHARMACIST - PHYSICIAN ORDER COMMUNICATION  CONCERNING: P&T Medication Policy on Herbal Medications  DESCRIPTION:  This patient's order for:  Chromium 1000 mcg  has been noted.  This product(s) is classified as an "herbal" or natural product. Due to a lack of definitive safety studies or FDA approval, nonstandard manufacturing practices, plus the potential risk of unknown drug-drug interactions while on inpatient medications, the Pharmacy and Therapeutics Committee does not permit the use of "herbal" or natural products of this type within [facility].   ACTION TAKEN: The pharmacy department is unable to verify this order at this time.  Please reevaluate patient's clinical condition at discharge and address if the herbal or natural product(s) should be resumed at that time.   []   We informed your patient of this safety policy and the herbal therapy interruption while hospitalized.  Gypsy Decant

## 2019-12-21 NOTE — Transfer of Care (Signed)
Immediate Anesthesia Transfer of Care Note  Patient: Jaman Aro  Procedure(s) Performed: TOTAL HIP ARTHROPLASTY (Right Hip)  Patient Location: PACU  Anesthesia Type:Spinal  Level of Consciousness: sedated, patient cooperative and responds to stimulation  Airway & Oxygen Therapy: Patient Spontanous Breathing and Patient connected to face mask oxygen  Post-op Assessment: Report given to RN and Post -op Vital signs reviewed and stable  Post vital signs: Reviewed and stable  Last Vitals:  Vitals Value Taken Time  BP 140/66 12/21/19 1704  Temp    Pulse 59 12/21/19 1705  Resp 15 12/21/19 1705  SpO2 100 % 12/21/19 1705  Vitals shown include unvalidated device data.  Last Pain:  Vitals:   12/21/19 1126  TempSrc: Oral  PainSc:       Patients Stated Pain Goal: 4 (03/83/33 8329)  Complications: No complications documented.

## 2019-12-21 NOTE — Op Note (Signed)
12/21/2019  4:24 PM  PATIENT:  David Reed   MRN: [id number]  PRE-OPERATIVE DIAGNOSIS:  Right hip primary localized osteoarthritis  POST-OPERATIVE DIAGNOSIS:  same  PROCEDURE:  Procedure(s): RIGHT TOTAL HIP ARTHROPLASTY  PREOPERATIVE INDICATIONS:    Cecilio Ohlrich is an 64 y.o. male who has a diagnosis of right hip primary localized osteoarthritis and elected for surgical management after failing conservative treatment.  The risks benefits and alternatives were discussed with the patient including but not limited to the risks of nonoperative treatment, versus surgical intervention including infection, bleeding, nerve injury, periprosthetic fracture, the need for revision surgery, dislocation, leg length discrepancy, blood clots, cardiopulmonary complications, morbidity, mortality, among others, and they were willing to proceed.     OPERATIVE REPORT     SURGEON:  Marchia Bond, MD    ASSISTANT:  Merlene Pulling, PA-C, (Present throughout the entire procedure,  necessary for completion of procedure in a timely manner, assisting with retraction, instrumentation, and closure)     ANESTHESIA: Spinal  ESTIMATED BLOOD LOSS: 314 mL    COMPLICATIONS:  None.     UNIQUE ASPECTS OF THE CASE: I had just a small amount of the anterior wall showing, and had a slight amount of posterior cup showing.  I could not get the superior wing to work, but used a Child psychotherapist.  My femoral neck cut was probably a little bit deeper than the contralateral side, and I worked to lateralize the stem to prevent varus, and was able to get to a size 7, as compared to the 6 on the contralateral side, although the 7 was just slightly proud, 1 tooth showing.  I did use a high offset, and the soft tissues just barely reached the femur, and the hip had excellent stability.  COMPONENTS:  [employer] fit femur size 7 with a 36 mm + 1.5 metallic head ball and a Gription Acetabular shell size 56 reamed line to  line, with a single cancellous screw for backup fixation, with an apex hole eliminator and a +4 neutral polyethylene liner.    PROCEDURE IN DETAIL:   The patient was met in the holding area and  identified.  The appropriate hip was identified and marked at the operative site.  The patient was then transported to the OR  and  placed under anesthesia.  At that point, the patient was  placed in the lateral decubitus position with the operative side up and  secured to the operating room table and all bony prominences padded.     The operative lower extremity was prepped from the iliac crest to the distal leg.  Sterile draping was performed.  Time out was performed prior to incision.      A routine posterolateral approach was utilized via sharp dissection  carried down to the subcutaneous tissue.  Gross bleeders were Bovie coagulated.  The iliotibial band was identified and incised along the length of the skin incision.  Self-retaining retractors were  inserted.  With the hip internally rotated, the short external rotators  were identified. The piriformis and capsule was tagged with FiberWire, and the hip capsule released in a T-type fashion.  The femoral neck was exposed, and I resected the femoral neck using the appropriate jig. This was performed at approximately a thumb's breadth above the lesser trochanter.    I then exposed the deep acetabulum, cleared out any tissue including the ligamentum teres.  A wing retractor was placed but did not hold, so I used a manual  retractor.  After adequate visualization, I excised the labrum, and then sequentially reamed.  I placed the trial acetabulum, which seated nicely, and then impacted the real cup into place.  Appropriate version and inclination was confirmed clinically matching their bony anatomy, and also with the use of the jig.  I placed a cancellous screw to augment fixation.  A trial polyethylene liner was placed and the wing retractor removed.    I  then prepared the proximal femur using the cookie-cutter, the lateralizing reamer, and then sequentially reamed and broached.  A trial broach, neck, and head was utilized, and I reduced the hip and it was found to have excellent stability with functional range of motion. The trial components were then removed, and the real polyethylene liner was placed.  I then impacted the real femoral prosthesis into place into the appropriate version, slightly anteverted to the normal anatomy, and I impacted the real head ball into place. The hip was then reduced and taken through functional range of motion and found to have excellent stability. Leg lengths were restored.  I then used a 2 mm drill bits to pass the FiberWire suture from the capsule and piriformis through the greater trochanter, and secured this. Excellent posterior capsular repair was achieved. I also closed the T in the capsule.  I then irrigated the hip copiously again with pulse lavage, and repaired the fascia with Vicryl, followed by Vicryl for the subcutaneous tissue, Monocryl for the skin, Steri-Strips and sterile gauze. The wounds were injected. The patient was then awakened and returned to PACU in stable and satisfactory condition. There were no complications.  Marchia Bond, MD Orthopedic Surgeon [phone]   12/21/2019 4:24 PM

## 2019-12-21 NOTE — Anesthesia Procedure Notes (Addendum)
Procedure Name: MAC Date/Time: 12/21/2019 2:10 PM Performed by: West Pugh, CRNA Pre-anesthesia Checklist: Patient identified, Emergency Drugs available, Suction available, Patient being monitored and Timeout performed Patient Re-evaluated:Patient Re-evaluated prior to induction Oxygen Delivery Method: Simple face mask Preoxygenation: Pre-oxygenation with 100% oxygen Induction Type: IV induction Number of attempts: 1 Placement Confirmation: positive ETCO2 Dental Injury: Teeth and Oropharynx as per pre-operative assessment

## 2019-12-21 NOTE — Anesthesia Procedure Notes (Addendum)
Spinal  Patient location during procedure: OR Start time: 12/21/2019 2:13 PM End time: 12/21/2019 2:19 PM Staffing Performed: resident/CRNA  Anesthesiologist: Effie Berkshire, MD Resident/CRNA: West Pugh, CRNA Preanesthetic Checklist Completed: patient identified, IV checked, site marked, risks and benefits discussed, surgical consent, monitors and equipment checked, pre-op evaluation and timeout performed Spinal Block Patient position: sitting Prep: DuraPrep and site prepped and draped Patient monitoring: heart rate, cardiac monitor, continuous pulse ox and blood pressure Approach: midline Location: L3-4 Injection technique: single-shot Needle Needle type: Pencan and Introducer  Needle gauge: 24 G Needle length: 10 cm Assessment Sensory level: T4 Additional Notes IV functioning, monitors applied to pt. Expiration date of kit checked and confirmed to be in date. Sterile prep and drape, hand hygiene and sterile gloved used. Pt was positioned and spine was prepped in sterile fashion. Skin was anesthetized with lidocaine. Free flow of clear CSF obtained prior to injecting local anesthetic into CSF.Marland Kitchen Spinal needle aspirated freely following injection. Needle was carefully withdrawn, and pt tolerated procedure well. Loss of motor and sensory on exam post injection. Dr Smith Robert present for procedure.

## 2019-12-21 NOTE — Anesthesia Preprocedure Evaluation (Signed)
Anesthesia Evaluation  Patient identified by MRN, date of birth, ID band Patient awake    Reviewed: Allergy & Precautions, NPO status , Patient's Chart, lab work & pertinent test results  Airway Mallampati: I  TM Distance: >3 FB Neck ROM: Full    Dental  (+) Teeth Intact, Dental Advisory Given   Pulmonary    breath sounds clear to auscultation       Cardiovascular hypertension,  Rhythm:Regular Rate:Normal     Neuro/Psych    GI/Hepatic Neg liver ROS,   Endo/Other  negative endocrine ROS  Renal/GU      Musculoskeletal  (+) Arthritis , Osteoarthritis,    Abdominal Normal abdominal exam  (+)   Peds  Hematology negative hematology ROS (+)   Anesthesia Other Findings   Reproductive/Obstetrics                             Anesthesia Physical Anesthesia Plan  ASA: II  Anesthesia Plan: Spinal   Post-op Pain Management:    Induction: Intravenous  PONV Risk Score and Plan: 2 and Ondansetron and Propofol infusion  Airway Management Planned: Natural Airway and Simple Face Mask  Additional Equipment: None  Intra-op Plan:   Post-operative Plan:   Informed Consent: I have reviewed the patients History and Physical, chart, labs and discussed the procedure including the risks, benefits and alternatives for the proposed anesthesia with the patient or authorized representative who has indicated his/her understanding and acceptance.       Plan Discussed with: CRNA  Anesthesia Plan Comments: (Lab Results      Component                Value               Date                      WBC                      3.1 (L)             12/14/2019                HGB                      13.6                12/14/2019                HCT                      42.2                12/14/2019                MCV                      93.8                12/14/2019                PLT                      260                  12/14/2019           )  Anesthesia Quick Evaluation  

## 2019-12-21 NOTE — H&P (Signed)
PREOPERATIVE H&P  Chief Complaint: Right hip pain  HPI: David Reed is a 64 y.o. male who presents for preoperative history and physical with a diagnosis of right hip primary localized osteoarthritis. Symptoms are rated as moderate to severe, and have been worsening.  This is significantly impairing activities of daily living.  He has elected for surgical management.   He has failed injections, activity modification, anti-inflammatories, and assistive devices.  Preoperative X-rays demonstrate end stage degenerative changes with osteophyte formation, loss of joint space, subchondral sclerosis.  He has already had the left side done and is happy with the results.  Past Medical History:  Diagnosis Date  . Allergy   . Arthritis   . DDD (degenerative disc disease), lumbar    no meds  . Hypertension    Past Surgical History:  Procedure Laterality Date  . ROTATOR CUFF REPAIR     right shoulder surgery  . TOTAL HIP ARTHROPLASTY Left 04/13/2019   Procedure: TOTAL HIP ARTHROPLASTY;  Surgeon: Marchia Bond, MD;  Location: [facility];  Service: Orthopedics;  Laterality: Left;   Social History   Socioeconomic History  . Marital status: Married    Spouse name: Not on file  . Number of children: Not on file  . Years of education: Not on file  . Highest education level: Not on file  Occupational History  . Not on file  Tobacco Use  . Smoking status: Never Smoker  . Smokeless tobacco: Never Used  Vaping Use  . Vaping Use: Never used  Substance and Sexual Activity  . Alcohol use: Yes    Alcohol/week: 1.0 standard drink    Types: 1 Cans of beer per week    Comment: occasional  . Drug use: Yes    Types: Marijuana  . Sexual activity: Yes  Other Topics Concern  . Not on file  Social History Narrative   .   Social Determinants of Health   Financial Resource Strain:   . Difficulty of Paying Living Expenses: Not on file  Food Insecurity:   . Worried About Charity fundraiser in the  Last Year: Not on file  . Ran Out of Food in the Last Year: Not on file  Transportation Needs:   . Lack of Transportation (Medical): Not on file  . Lack of Transportation (Non-Medical): Not on file  Physical Activity:   . Days of Exercise per Week: Not on file  . Minutes of Exercise per Session: Not on file  Stress:   . Feeling of Stress : Not on file  Social Connections:   . Frequency of Communication with Friends and Family: Not on file  . Frequency of Social Gatherings with Friends and Family: Not on file  . Attends Religious Services: Not on file  . Active Member of Clubs or Organizations: Not on file  . Attends Archivist Meetings: Not on file  . Marital Status: Not on file   Family History  Problem Relation Age of Onset  . Alcoholism Father    Allergies  Allergen Reactions  . Prednisone Hives   Prior to Admission medications   Medication Sig Start Date End Date Taking? Authorizing Provider  Ascorbic Acid (VITAMIN C) 500 MG CAPS Take 500 mg by mouth daily.    Yes [provider]  cetirizine (ZYRTEC) 10 MG tablet Take 10 mg by mouth daily.   Yes [provider]  Chromium 1000 MCG TABS Take 1,000 mcg by mouth daily.   Yes [provider]  Digestive Enzymes (DIGESTIVE ENZYME PO) Take 2 capsules by mouth 2 (two) times daily before a meal. Primagest Digestive Enzymes Nutritional Supplement    Yes [provider]  FIBER PO Take 1 Scoop by mouth daily.    Yes [provider]  hydrochlorothiazide (HYDRODIURIL) 50 MG tablet Take 100 mg by mouth daily.    Yes [provider]  ibuprofen (ADVIL) 200 MG tablet Take 400-600 mg by mouth every 6 (six) hours as needed for moderate pain.    Yes [provider]  losartan (COZAAR) 100 MG tablet Take 100 mg by mouth daily.   Yes [provider]  Omega 3 1200 MG CAPS Take 1,200 mg by mouth daily.    Yes [provider]  Protease POWD Take 1 capsule by mouth  daily. Protease Enzyme   Yes [provider]  albuterol (VENTOLIN HFA) 108 (90 Base) MCG/ACT inhaler Inhale 1-2 puffs into the lungs every 6 (six) hours as needed for wheezing or shortness of breath. Patient not taking: Reported on 11/30/2019    [provider]  aspirin EC 325 MG tablet Take 1 tablet (325 mg total) by mouth 2 (two) times daily. Patient not taking: Reported on 11/30/2019 04/13/19   Ventura Bruns, PA-C  baclofen (LIORESAL) 10 MG tablet Take 1 tablet (10 mg total) by mouth 3 (three) times daily. As needed for muscle spasm Patient not taking: Reported on 11/30/2019 04/13/19   Ventura Bruns, PA-C  Multiple Vitamin (MULTIVITAMIN WITH MINERALS) TABS tablet Take 1 tablet by mouth daily. Patient not taking: Reported on 11/30/2019    [provider]  NON FORMULARY Take 1-2 capsules by mouth 2 (two) times daily as needed (gas).  Patient not taking: Reported on 11/30/2019    [provider]  ondansetron (ZOFRAN) 4 MG tablet Take 1 tablet (4 mg total) by mouth every 8 (eight) hours as needed for nausea or vomiting. Patient not taking: Reported on 11/30/2019 04/13/19   Ventura Bruns, PA-C  oxyCODONE (ROXICODONE) 5 MG immediate release tablet Take 1 tablet (5 mg total) by mouth every 4 (four) hours as needed for severe pain. Patient not taking: Reported on 11/30/2019 04/13/19   Merlene Pulling K, PA-C  sennosides-docusate sodium (SENOKOT-S) 8.6-50 MG tablet Take 2 tablets by mouth daily. Patient not taking: Reported on 11/30/2019 04/13/19   Merlene Pulling K, PA-C     Positive ROS: All other systems have been reviewed and were otherwise negative with the exception of those mentioned in the HPI and as above.  Physical Exam: General: Alert, no acute distress Cardiovascular: No pedal edema Respiratory: No cyanosis, no use of accessory musculature GI: No organomegaly, abdomen is soft and non-tender Skin: No lesions in the area of chief complaint Neurologic: Sensation  intact distally Psychiatric: Patient is competent for consent with normal mood and affect Lymphatic: No axillary or cervical lymphadenopathy  MUSCULOSKELETAL: Right hip has 0 to 80 degrees of motion with a painful arc and no internal rotation EHL is intact.  Assessment: Right hip primary localized osteoarthritis   Plan: Plan for Procedure(s): TOTAL HIP ARTHROPLASTY  The risks benefits and alternatives were discussed with the patient including but not limited to the risks of nonoperative treatment, versus surgical intervention including infection, bleeding, nerve injury,  blood clots, cardiopulmonary complications, morbidity, mortality, among others, and they were willing to proceed.    Patient's anticipated LOS is less than 2 midnights, meeting these requirements: - Younger than 24 - Lives within 1 hour of  care - Has a competent adult at home to recover with post-op recover - NO history of  - Chronic pain requiring opiods  - Diabetes  - Coronary Artery Disease  - Heart failure  - Heart attack  - Stroke  - DVT/VTE  - Cardiac arrhythmia  - Respiratory Failure/COPD  - Renal failure  - Anemia  - Advanced Liver disease        Johnny Bridge, MD Cell [phone]   12/21/2019 1:15 PM

## 2019-12-21 NOTE — Plan of Care (Signed)
  Problem: Education: Goal: Knowledge of General Education information will improve Description: Including pain rating scale, medication(s)/side effects and non-pharmacologic comfort measures Outcome: Progressing   Problem: Activity: Goal: Risk for activity intolerance will decrease Outcome: Progressing   Problem: Nutrition: Goal: Adequate nutrition will be maintained Outcome: Progressing   Problem: Coping: Goal: Level of anxiety will decrease Outcome: Progressing   Problem: Elimination: Goal: Will not experience complications related to bowel motility Outcome: Progressing Goal: Will not experience complications related to urinary retention Outcome: Progressing   Problem: Pain Managment: Goal: General experience of comfort will improve Outcome: Progressing   Problem: Safety: Goal: Ability to remain free from injury will improve Outcome: Progressing   Problem: Education: Goal: Knowledge of the prescribed therapeutic regimen will improve Outcome: Progressing   Problem: Activity: Goal: Ability to avoid complications of mobility impairment will improve Outcome: Progressing   Problem: Pain Management: Goal: Pain level will decrease with appropriate interventions Outcome: Progressing

## 2019-12-22 ENCOUNTER — Encounter (HOSPITAL_COMMUNITY): Payer: Self-pay | Admitting: Orthopedic Surgery

## 2019-12-22 DIAGNOSIS — M1611 Unilateral primary osteoarthritis, right hip: Secondary | ICD-10-CM | POA: Diagnosis not present

## 2019-12-22 LAB — CBC
HCT: 38.1 % — ABNORMAL LOW (ref 39.0–52.0)
Hemoglobin: 12.3 g/dL — ABNORMAL LOW (ref 13.0–17.0)
MCH: 30 pg (ref 26.0–34.0)
MCHC: 32.3 g/dL (ref 30.0–36.0)
MCV: 92.9 fL (ref 80.0–100.0)
Platelets: 267 10*3/uL (ref 150–400)
RBC: 4.1 MIL/uL — ABNORMAL LOW (ref 4.22–5.81)
RDW: 14 % (ref 11.5–15.5)
WBC: 8.2 10*3/uL (ref 4.0–10.5)
nRBC: 0 % (ref 0.0–0.2)

## 2019-12-22 LAB — BASIC METABOLIC PANEL
Anion gap: 9 (ref 5–15)
BUN: 21 mg/dL (ref 8–23)
CO2: 27 mmol/L (ref 22–32)
Calcium: 9 mg/dL (ref 8.9–10.3)
Chloride: 102 mmol/L (ref 98–111)
Creatinine, Ser: 1.02 mg/dL (ref 0.61–1.24)
GFR calc Af Amer: >60 mL/min (ref >60)
GFR calc non Af Amer: >60 mL/min (ref >60)
Glucose, Bld: 143 mg/dL — ABNORMAL HIGH (ref 70–99)
Potassium: 3.9 mmol/L (ref 3.5–5.1)
Sodium: 138 mmol/L (ref 135–145)

## 2019-12-22 MED ORDER — BACLOFEN 10 MG PO TABS: 10.0000 mg | ORAL_TABLET | Freq: Three times a day (TID) | ORAL | 0 refills | Status: DC

## 2019-12-22 MED ORDER — HYDROCODONE-ACETAMINOPHEN 10-325 MG PO TABS: 1.0000 | ORAL_TABLET | ORAL | 0 refills | Status: DC | PRN

## 2019-12-22 NOTE — Plan of Care (Signed)
Pt ready for DC home 

## 2019-12-22 NOTE — Evaluation (Addendum)
Physical Therapy Evaluation Patient Details Name: David Reed MRN: [id number] DOB: [birth year] Today's Date: 12/22/2019   History of Present Illness  64 yo male s/p R THA-posterior. Hx of L THA-posterior 12/20, R RCR  Clinical Impression  On eval, pt was Min assist for bed mobility and Supv for ambulation. He walked ~200 feet with use of a RW. Moderate pain with activity. Reviewed hip precautions. Discussed car transfer technique for passenger seat (pt stated he may decide to sit across back seat). Pt has all DME. All education completed for acute setting. Okay to d/c from PT standpoint. Pt would like a few sessions of HHPT to continue education in home environment.     Follow Up Recommendations Follow surgeon's recommendation for DC plan and follow-up therapies;Home health PT    Equipment Recommendations  None recommended by PT    Recommendations for Other Services       Precautions / Restrictions Precautions Precautions: Fall;Posterior Hip Precaution Booklet Issued: Yes (comment) Precaution Comments: Reviewed posterior precautions. Pt able to recall 2/3. Restrictions Weight Bearing Restrictions: No Other Position/Activity Restrictions: WBAT      Mobility  Bed Mobility Overal bed mobility: Needs Assistance Bed Mobility: Supine to Sit;Sit to Supine     Supine to sit: Min assist;HOB elevated Sit to supine: Min assist;HOB elevated   General bed mobility comments: Assist for R LE. Cues for safety, adherence to precautions, technique.  Transfers Overall transfer level: Needs assistance Equipment used: Rolling walker (2 wheeled) Transfers: Sit to/from Stand Sit to Stand: Supervision         General transfer comment: VCs safety, hand/LE placement.  Ambulation/Gait Ambulation/Gait assistance: Supervision Gait Distance (Feet): 200 Feet Assistive device: Rolling walker (2 wheeled) Gait Pattern/deviations: Step-through pattern;Decreased stride length     General Gait  Details: Pt reported feeling as if he has a leg length discrepancy. Noted some mild vaulting on L LE.  Stairs Stairs:  (Pt stated he plans to stay on 1st level for time being. No need to practice stairs at this time)          Wheelchair Mobility    Modified Rankin (Stroke Patients Only)       Balance Overall balance assessment: Needs assistance         Standing balance support: Bilateral upper extremity supported Standing balance-Leahy Scale: Fair                               Pertinent Vitals/Pain Pain Assessment: 0-10 Pain Score: 5  Pain Location: R hip Pain Descriptors / Indicators: Discomfort;Sore Pain Intervention(s): Monitored during session;Repositioned;Ice applied    Home Living Family/patient expects to be discharged to:: Private residence Living Arrangements: Spouse/significant other Available Help at Discharge: Family Type of Home: House Home Access: Level entry     Home Layout: Multi-level;Able to live on main level with bedroom/bathroom Home Equipment: Gilford Rile - 2 wheels;Cane - single point      Prior Function Level of Independence: Independent               Hand Dominance        Extremity/Trunk Assessment   Upper Extremity Assessment Upper Extremity Assessment: Overall WFL for tasks assessed    Lower Extremity Assessment Lower Extremity Assessment: Generalized weakness    Cervical / Trunk Assessment Cervical / Trunk Assessment: Normal  Communication   Communication: No difficulties  Cognition Arousal/Alertness: Awake/alert Behavior During Therapy: WFL for tasks assessed/performed Overall Cognitive Status: Within  Functional Limits for tasks assessed                                        General Comments      Exercises Total Joint Exercises Ankle Circles/Pumps: AROM;Both;10 reps;Supine Quad Sets: AROM;Both;10 reps;Supine Heel Slides: AAROM;Right;10 reps;Supine Hip ABduction/ADduction:  AAROM;Right;10 reps;Supine   Assessment/Plan    PT Assessment Patient needs continued PT services  PT Problem List Decreased strength;Decreased mobility;Decreased range of motion;Decreased balance;Decreased activity tolerance;Decreased knowledge of use of DME;Pain;Decreased knowledge of precautions       PT Treatment Interventions DME instruction;Gait training;Therapeutic activities;Therapeutic exercise;Patient/family education;Stair training;Balance training;Functional mobility training    PT Goals (Current goals can be found in the Care Plan section)  Acute Rehab PT Goals Patient Stated Goal: i'm ready to go PT Goal Formulation: With patient Time For Goal Achievement: 01/05/20 Potential to Achieve Goals: Good    Frequency 7X/week   Barriers to discharge        Co-evaluation               AM-PAC PT "6 Clicks" Mobility  Outcome Measure Help needed turning from your back to your side while in a flat bed without using bedrails?: A Little Help needed moving from lying on your back to sitting on the side of a flat bed without using bedrails?: A Little Help needed moving to and from a bed to a chair (including a wheelchair)?: A Little Help needed standing up from a chair using your arms (e.g., wheelchair or bedside chair)?: A Little Help needed to walk in hospital room?: A Little Help needed climbing 3-5 steps with a railing? : A Little 6 Click Score: 18    End of Session Equipment Utilized During Treatment: Gait belt Activity Tolerance: Patient tolerated treatment well Patient left: in bed;with call bell/phone within reach   PT Visit Diagnosis: Pain;Other abnormalities of gait and mobility (R26.89) Pain - Right/Left: Right Pain - part of body: Hip    Time: 1108-1130 PT Time Calculation (min) (ACUTE ONLY): 22 min   Charges:   PT Evaluation $PT Eval Low Complexity: [place], PT Acute Rehabilitation  Office: [phone] Pager:  [phone]

## 2019-12-22 NOTE — Progress Notes (Signed)
Subjective: 1 Day Post-Op s/p Procedure(s): TOTAL HIP ARTHROPLASTY  Patient reports minimal pain this morning. No chest pain, shortness of breath, abdominal pain. No other complaints.  Objective:  PE: VITALS:   Vitals:   12/21/19 2236 12/21/19 2327 12/22/19 0229 12/22/19 0513  BP: 139/69 134/71 (!) 136/50 131/64  Pulse: (!) 59 63 (!) 53 (!) 52  Resp: 18 18 16 16   Temp: 98.5 F (36.9 C) 98.2 F (36.8 C) 98.1 F (36.7 C) 98.2 F (36.8 C)  TempSrc: Oral Oral Oral Oral  SpO2: 99% 96% 100% 100%  Weight:      Height:        ABD soft Neurovascular intact Sensation intact distally Intact pulses distally Dorsiflexion/Plantar flexion intact Incision: scant drainage  LABS  Results for orders placed or performed during the hospital encounter of 12/21/19 (from the past 24 hour(s))  CBC     Status: Abnormal   Collection Time: 12/22/19  2:31 AM  Result Value Ref Range   WBC 8.2 4.0 - 10.5 K/uL   RBC 4.10 (L) 4.22 - 5.81 MIL/uL   Hemoglobin 12.3 (L) 13.0 - 17.0 g/dL   HCT 38.1 (L) 39 - 52 %   MCV 92.9 80.0 - 100.0 fL   MCH 30.0 26.0 - 34.0 pg   MCHC 32.3 30.0 - 36.0 g/dL   RDW 14.0 11.5 - 15.5 %   Platelets 267 150 - 400 K/uL   nRBC 0.0 0.0 - 0.2 %  Basic metabolic panel     Status: Abnormal   Collection Time: 12/22/19  2:31 AM  Result Value Ref Range   Sodium 138 135 - 145 mmol/L   Potassium 3.9 3.5 - 5.1 mmol/L   Chloride 102 98 - 111 mmol/L   CO2 27 22 - 32 mmol/L   Glucose, Bld 143 (H) 70 - 99 mg/dL   BUN 21 8 - 23 mg/dL   Creatinine, Ser 1.02 0.61 - 1.24 mg/dL   Calcium 9.0 8.9 - 10.3 mg/dL   GFR calc non Af Amer >60 >60 mL/min   GFR calc Af Amer >60 >60 mL/min   Anion gap 9 5 - 15    DG Pelvis Portable  Result Date: 12/21/2019 CLINICAL DATA:  Right hip arthroplasty. EXAM: PORTABLE PELVIS 1-2 VIEWS COMPARISON:  Concurrent right hip exam. Prior pelvis radiograph 03/14/2019 FINDINGS: New right hip arthroplasty in expected alignment. Femoral stem is  midline. Recent postsurgical change includes air and edema in the soft tissues and joint space. Left hip arthroplasty is unchanged without complication. IMPRESSION: New right hip arthroplasty without immediate postoperative complication. Electronically Signed   By: Keith Rake M.D.   On: 12/21/2019 17:39   DG Hip Port Unilat With Pelvis 1V Right  Result Date: 12/21/2019 CLINICAL DATA:  Right hip arthroplasty. EXAM: DG HIP (WITH OR WITHOUT PELVIS) 1V PORT RIGHT COMPARISON:  Concurrent pelvis radiograph. FINDINGS: Right hip arthroplasty in expected alignment. The femoral stem is midline. There is no periprosthetic lucency or fracture. Acetabular cup is fully demonstrated on the concurrent pelvis exam. Recent postsurgical change includes air and edema in the soft tissues and joint space. IMPRESSION: Right hip arthroplasty without immediate postoperative complication. Electronically Signed   By: Keith Rake M.D.   On: 12/21/2019 17:40    Assessment/Plan: Principal Problem:   Osteoarthritis of right hip Active Problems:   S/P hip replacement, right   1 Day Post-Op s/p Procedure(s): TOTAL HIP ARTHROPLASTY  Weightbearing: WBAT RLE Insicional and dressing care: Reinforce dressings  as needed VTE prophylaxis: Aspirin 325mg  BID x 30 days Pain control: will discharge with hydrocodone 10/325 Follow - up plan: 2 weeks with Dr. Mardelle Matte Dispo: pending PT eval, ok to discharge when passes PT  Contact information:   Weekdays 8-5 Merlene Pulling, PA-C [phone] A fter hours and holidays please check [email or web address] for group call information for [facility]  Ventura Bruns 12/22/2019, 7:24 AM

## 2019-12-22 NOTE — Discharge Summary (Signed)
Discharge Summary  Patient ID: David Reed MRN: [id number] DOB/AGE: 66-Jul-1957 66 y.o.  Admit date: 12/21/2019 Discharge date: 12/22/2019  Admission Diagnoses:  Osteoarthritis of right hip  Discharge Diagnoses:  Principal Problem:   Osteoarthritis of right hip Active Problems:   S/P hip replacement, right   Past Medical History:  Diagnosis Date  . Allergy   . Arthritis   . DDD (degenerative disc disease), lumbar    no meds  . Hypertension     Surgeries: Procedure(s): TOTAL HIP ARTHROPLASTY on 12/21/2019   Consultants (if any):   Discharged Condition: Improved  Hospital Course: Korrey Schleicher is an 64 y.o. male who was admitted 12/21/2019 with a diagnosis of Osteoarthritis of right hip and went to the operating room on 12/21/2019 and underwent the above named procedures.    He was given perioperative antibiotics:  Anti-infectives (From admission, onward)   Start     Dose/Rate Route Frequency Ordered Stop   12/21/19 2000  ceFAZolin (ANCEF) IVPB 2g/100 mL premix        2 g 200 mL/hr over 30 Minutes Intravenous Every 6 hours 12/21/19 1847 12/22/19 0244   12/21/19 1000  ceFAZolin (ANCEF) IVPB 2g/100 mL premix        2 g 200 mL/hr over 30 Minutes Intravenous On call to O.R. 12/21/19 6314 12/21/19 1450    .  He was given sequential compression devices, early ambulation, and aspirin for DVT prophylaxis.  He benefited maximally from the hospital stay and there were no complications.    Recent vital signs:  Vitals:   12/22/19 0229 12/22/19 0513  BP: (!) 136/50 131/64  Pulse: (!) 53 (!) 52  Resp: 16 16  Temp: 98.1 F (36.7 C) 98.2 F (36.8 C)  SpO2: 100% 100%    Recent laboratory studies:  Lab Results  Component Value Date   HGB 12.3 (L) 12/22/2019   HGB 13.6 12/14/2019   HGB 12.7 (L) 04/14/2019   Lab Results  Component Value Date   WBC 8.2 12/22/2019   PLT 267 12/22/2019   No results found for: INR Lab Results  Component Value Date   NA 138 12/22/2019    K 3.9 12/22/2019   CL 102 12/22/2019   CO2 27 12/22/2019   BUN 21 12/22/2019   CREATININE 1.02 12/22/2019   GLUCOSE 143 (H) 12/22/2019    Discharge Medications:   Allergies as of 12/22/2019      Reactions   Prednisone Hives      Medication List    STOP taking these medications   albuterol 108 (90 Base) MCG/ACT inhaler Commonly known as: VENTOLIN HFA   ibuprofen 200 MG tablet Commonly known as: ADVIL     TAKE these medications   aspirin EC 325 MG tablet Take 1 tablet (325 mg total) by mouth 2 (two) times daily.   baclofen 10 MG tablet Commonly known as: LIORESAL Take 1 tablet (10 mg total) by mouth 3 (three) times daily. As needed for muscle spasm What changed: Another medication with the same name was added. Make sure you understand how and when to take each.   baclofen 10 MG tablet Commonly known as: LIORESAL Take 1 tablet (10 mg total) by mouth 3 (three) times daily. As needed for muscle spasm What changed: You were already taking a medication with the same name, and this prescription was added. Make sure you understand how and when to take each.   cetirizine 10 MG tablet Commonly known as: ZYRTEC Take 10 mg by mouth  daily.   Chromium 1000 MCG Tabs Take 1,000 mcg by mouth daily.   DIGESTIVE ENZYME PO Take 2 capsules by mouth 2 (two) times daily before a meal. Primagest Digestive Enzymes Nutritional Supplement   FIBER PO Take 1 Scoop by mouth daily.   hydrochlorothiazide 50 MG tablet Commonly known as: HYDRODIURIL Take 100 mg by mouth daily.   HYDROcodone-acetaminophen 10-325 MG tablet Commonly known as: Norco Take 1 tablet by mouth every 4 (four) hours as needed.   losartan 100 MG tablet Commonly known as: COZAAR Take 100 mg by mouth daily.   multivitamin with minerals Tabs tablet Take 1 tablet by mouth daily.   NON FORMULARY Take 1-2 capsules by mouth 2 (two) times daily as needed (gas).   Omega 3 1200 MG Caps Take 1,200 mg by mouth daily.    ondansetron 4 MG tablet Commonly known as: Zofran Take 1 tablet (4 mg total) by mouth every 8 (eight) hours as needed for nausea or vomiting.   oxyCODONE 5 MG immediate release tablet Commonly known as: Roxicodone Take 1 tablet (5 mg total) by mouth every 4 (four) hours as needed for severe pain.   Protease Powd Take 1 capsule by mouth daily. Protease Enzyme   sennosides-docusate sodium 8.6-50 MG tablet Commonly known as: SENOKOT-S Take 2 tablets by mouth daily.   Vitamin C 500 MG Caps Take 500 mg by mouth daily.       Diagnostic Studies: DG Pelvis Portable  Result Date: 12/21/2019 CLINICAL DATA:  Right hip arthroplasty. EXAM: PORTABLE PELVIS 1-2 VIEWS COMPARISON:  Concurrent right hip exam. Prior pelvis radiograph 03/14/2019 FINDINGS: New right hip arthroplasty in expected alignment. Femoral stem is midline. Recent postsurgical change includes air and edema in the soft tissues and joint space. Left hip arthroplasty is unchanged without complication. IMPRESSION: New right hip arthroplasty without immediate postoperative complication. Electronically Signed   By: Keith Rake M.D.   On: 12/21/2019 17:39   DG Hip Port Unilat With Pelvis 1V Right  Result Date: 12/21/2019 CLINICAL DATA:  Right hip arthroplasty. EXAM: DG HIP (WITH OR WITHOUT PELVIS) 1V PORT RIGHT COMPARISON:  Concurrent pelvis radiograph. FINDINGS: Right hip arthroplasty in expected alignment. The femoral stem is midline. There is no periprosthetic lucency or fracture. Acetabular cup is fully demonstrated on the concurrent pelvis exam. Recent postsurgical change includes air and edema in the soft tissues and joint space. IMPRESSION: Right hip arthroplasty without immediate postoperative complication. Electronically Signed   By: Keith Rake M.D.   On: 12/21/2019 17:40    Disposition: Discharge disposition: 01-Home or Self Care          Follow-up Information    Marchia Bond, MD. Schedule an appointment as  soon as possible for a visit in 2 weeks.   Specialty: Orthopedic Surgery Contact information: [address] [place] [address] [phone]                Signed: Jola Baptist 12/22/2019, 7:29 AM

## 2019-12-22 NOTE — Anesthesia Postprocedure Evaluation (Signed)
Anesthesia Post Note  Patient: Lavin Petteway  Procedure(s) Performed: TOTAL HIP ARTHROPLASTY (Right Hip)     Patient location during evaluation: PACU Anesthesia Type: Spinal Level of consciousness: awake and alert Pain management: pain level controlled Vital Signs Assessment: post-procedure vital signs reviewed and stable Respiratory status: spontaneous breathing and respiratory function stable Cardiovascular status: blood pressure returned to baseline, stable and bradycardic Postop Assessment: spinal receding and no apparent nausea or vomiting Anesthetic complications: no   No complications documented.  Last Vitals:  Vitals:   12/22/19 0513 12/22/19 0951  BP: 131/64 (!) 125/97  Pulse: (!) 52 63  Resp: 16 16  Temp: 36.8 C   SpO2: 100% 100%    Last Pain:  Vitals:   12/22/19 1000  TempSrc:   PainSc: 0-No pain                 Audry Pili

## 2019-12-22 NOTE — TOC Transition Note (Signed)
Transition of Care [facility]) - CM/SW Discharge Note   Patient Details  Name: Keison Glendinning MRN: [id number] Date of Birth: 11/28/1955  Transition of Care [facility]) CM/SW Contact:  Lennart Pall, LCSW Phone Number: 12/22/2019, 1:01 PM   Clinical Narrative:    Met with pt to review dc needs.  Has needed DME.  Pt has worked with PT and aware recommendation for HHPT and he is in agreement.  Have placed referral for [facility].  No further TOC needs.   Final next level of care: [place] Barriers to Discharge: Barriers Resolved   Patient Goals and CMS Choice Patient states their goals for this hospitalization and ongoing recovery are:: go home CMS Medicare.gov Compare Post Acute Care list provided to:: Patient Choice offered to / list presented to : Patient  Discharge Placement                       Discharge Plan and Services                DME Arranged: N/A DME Agency: NA       HH Arranged: PT [place] Agency: [facility]formerly [employer]) Date [place]: 12/22/19 Time [place]: 59 Representative spoke with at [place]: [place] (Bartholomew) Interventions     Readmission Risk Interventions No flowsheet data found.

## 2019-12-23 DIAGNOSIS — Z96643 Presence of artificial hip joint, bilateral: Secondary | ICD-10-CM | POA: Diagnosis not present

## 2019-12-23 DIAGNOSIS — I10 Essential (primary) hypertension: Secondary | ICD-10-CM | POA: Diagnosis not present

## 2019-12-23 DIAGNOSIS — Z471 Aftercare following joint replacement surgery: Secondary | ICD-10-CM | POA: Diagnosis not present

## 2019-12-23 DIAGNOSIS — M5136 Other intervertebral disc degeneration, lumbar region: Secondary | ICD-10-CM | POA: Diagnosis not present

## 2019-12-27 DIAGNOSIS — Z96643 Presence of artificial hip joint, bilateral: Secondary | ICD-10-CM | POA: Diagnosis not present

## 2019-12-27 DIAGNOSIS — Z471 Aftercare following joint replacement surgery: Secondary | ICD-10-CM | POA: Diagnosis not present

## 2019-12-27 DIAGNOSIS — I10 Essential (primary) hypertension: Secondary | ICD-10-CM | POA: Diagnosis not present

## 2019-12-27 DIAGNOSIS — M5136 Other intervertebral disc degeneration, lumbar region: Secondary | ICD-10-CM | POA: Diagnosis not present

## 2019-12-29 DIAGNOSIS — I10 Essential (primary) hypertension: Secondary | ICD-10-CM | POA: Diagnosis not present

## 2019-12-29 DIAGNOSIS — Z96643 Presence of artificial hip joint, bilateral: Secondary | ICD-10-CM | POA: Diagnosis not present

## 2019-12-29 DIAGNOSIS — Z471 Aftercare following joint replacement surgery: Secondary | ICD-10-CM | POA: Diagnosis not present

## 2019-12-29 DIAGNOSIS — M5136 Other intervertebral disc degeneration, lumbar region: Secondary | ICD-10-CM | POA: Diagnosis not present

## 2019-12-31 DIAGNOSIS — M1611 Unilateral primary osteoarthritis, right hip: Secondary | ICD-10-CM | POA: Diagnosis not present

## 2019-12-31 DIAGNOSIS — I10 Essential (primary) hypertension: Secondary | ICD-10-CM | POA: Diagnosis not present

## 2019-12-31 DIAGNOSIS — M5136 Other intervertebral disc degeneration, lumbar region: Secondary | ICD-10-CM | POA: Diagnosis not present

## 2019-12-31 DIAGNOSIS — Z471 Aftercare following joint replacement surgery: Secondary | ICD-10-CM | POA: Diagnosis not present

## 2019-12-31 DIAGNOSIS — Z96643 Presence of artificial hip joint, bilateral: Secondary | ICD-10-CM | POA: Diagnosis not present

## 2020-01-05 DIAGNOSIS — M5136 Other intervertebral disc degeneration, lumbar region: Secondary | ICD-10-CM | POA: Diagnosis not present

## 2020-01-05 DIAGNOSIS — Z96643 Presence of artificial hip joint, bilateral: Secondary | ICD-10-CM | POA: Diagnosis not present

## 2020-01-05 DIAGNOSIS — I10 Essential (primary) hypertension: Secondary | ICD-10-CM | POA: Diagnosis not present

## 2020-01-05 DIAGNOSIS — Z471 Aftercare following joint replacement surgery: Secondary | ICD-10-CM | POA: Diagnosis not present

## 2020-01-11 DIAGNOSIS — R262 Difficulty in walking, not elsewhere classified: Secondary | ICD-10-CM | POA: Diagnosis not present

## 2020-01-11 DIAGNOSIS — M25551 Pain in right hip: Secondary | ICD-10-CM | POA: Diagnosis not present

## 2020-01-11 DIAGNOSIS — M6281 Muscle weakness (generalized): Secondary | ICD-10-CM | POA: Diagnosis not present

## 2020-01-11 DIAGNOSIS — M25651 Stiffness of right hip, not elsewhere classified: Secondary | ICD-10-CM | POA: Diagnosis not present

## 2020-01-13 DIAGNOSIS — M6281 Muscle weakness (generalized): Secondary | ICD-10-CM | POA: Diagnosis not present

## 2020-01-13 DIAGNOSIS — M25651 Stiffness of right hip, not elsewhere classified: Secondary | ICD-10-CM | POA: Diagnosis not present

## 2020-01-13 DIAGNOSIS — R262 Difficulty in walking, not elsewhere classified: Secondary | ICD-10-CM | POA: Diagnosis not present

## 2020-01-18 DIAGNOSIS — R262 Difficulty in walking, not elsewhere classified: Secondary | ICD-10-CM | POA: Diagnosis not present

## 2020-01-18 DIAGNOSIS — M25551 Pain in right hip: Secondary | ICD-10-CM | POA: Diagnosis not present

## 2020-01-18 DIAGNOSIS — M25651 Stiffness of right hip, not elsewhere classified: Secondary | ICD-10-CM | POA: Diagnosis not present

## 2020-01-18 DIAGNOSIS — M6281 Muscle weakness (generalized): Secondary | ICD-10-CM | POA: Diagnosis not present

## 2020-01-20 DIAGNOSIS — M6281 Muscle weakness (generalized): Secondary | ICD-10-CM | POA: Diagnosis not present

## 2020-01-20 DIAGNOSIS — R262 Difficulty in walking, not elsewhere classified: Secondary | ICD-10-CM | POA: Diagnosis not present

## 2020-01-20 DIAGNOSIS — M25551 Pain in right hip: Secondary | ICD-10-CM | POA: Diagnosis not present

## 2020-01-20 DIAGNOSIS — M25651 Stiffness of right hip, not elsewhere classified: Secondary | ICD-10-CM | POA: Diagnosis not present

## 2020-01-28 DIAGNOSIS — M25551 Pain in right hip: Secondary | ICD-10-CM | POA: Diagnosis not present

## 2020-02-02 DIAGNOSIS — M5136 Other intervertebral disc degeneration, lumbar region: Secondary | ICD-10-CM | POA: Diagnosis not present

## 2020-02-02 DIAGNOSIS — M62830 Muscle spasm of back: Secondary | ICD-10-CM | POA: Diagnosis not present

## 2020-02-02 DIAGNOSIS — M9902 Segmental and somatic dysfunction of thoracic region: Secondary | ICD-10-CM | POA: Diagnosis not present

## 2020-02-02 DIAGNOSIS — Z5181 Encounter for therapeutic drug level monitoring: Secondary | ICD-10-CM | POA: Diagnosis not present

## 2020-02-02 DIAGNOSIS — I10 Essential (primary) hypertension: Secondary | ICD-10-CM | POA: Diagnosis not present

## 2020-02-02 DIAGNOSIS — E7800 Pure hypercholesterolemia, unspecified: Secondary | ICD-10-CM | POA: Diagnosis not present

## 2020-02-02 DIAGNOSIS — R972 Elevated prostate specific antigen [PSA]: Secondary | ICD-10-CM | POA: Diagnosis not present

## 2020-02-02 DIAGNOSIS — Z0000 Encounter for general adult medical examination without abnormal findings: Secondary | ICD-10-CM | POA: Diagnosis not present

## 2020-02-02 DIAGNOSIS — M9904 Segmental and somatic dysfunction of sacral region: Secondary | ICD-10-CM | POA: Diagnosis not present

## 2020-02-09 ENCOUNTER — Ambulatory Visit: Payer: Medicare HMO | Admitting: Podiatry

## 2020-02-09 ENCOUNTER — Other Ambulatory Visit: Payer: Self-pay

## 2020-02-09 ENCOUNTER — Encounter: Payer: Self-pay | Admitting: Podiatry

## 2020-02-09 DIAGNOSIS — B351 Tinea unguium: Secondary | ICD-10-CM | POA: Diagnosis not present

## 2020-02-09 DIAGNOSIS — M9902 Segmental and somatic dysfunction of thoracic region: Secondary | ICD-10-CM | POA: Diagnosis not present

## 2020-02-09 DIAGNOSIS — M79675 Pain in left toe(s): Secondary | ICD-10-CM | POA: Diagnosis not present

## 2020-02-09 DIAGNOSIS — M79674 Pain in right toe(s): Secondary | ICD-10-CM

## 2020-02-09 DIAGNOSIS — M9904 Segmental and somatic dysfunction of sacral region: Secondary | ICD-10-CM | POA: Diagnosis not present

## 2020-02-09 DIAGNOSIS — L600 Ingrowing nail: Secondary | ICD-10-CM

## 2020-02-09 DIAGNOSIS — M5136 Other intervertebral disc degeneration, lumbar region: Secondary | ICD-10-CM | POA: Diagnosis not present

## 2020-02-09 DIAGNOSIS — M62830 Muscle spasm of back: Secondary | ICD-10-CM | POA: Diagnosis not present

## 2020-02-09 NOTE — Progress Notes (Signed)
Subjective:   Patient ID: David Reed, male   DOB: 64 y.o.   MRN: [id number]   HPI Patient presents with extreme thickness of his nailbeds 2 through 5 of both feet and he states the second and third left over the second and third right have been painful and he has very much difficulty trimming and he is wondering about ingrown's or possible removal.  Patient does not smoke likes to be active   Review of Systems  All other systems reviewed and are negative.       Objective:  Physical Exam Vitals and nursing note reviewed.  Constitutional:      Appearance: He is well-developed.  Pulmonary:     Effort: Pulmonary effort is normal.  Musculoskeletal:        General: Normal range of motion.  Skin:    General: Skin is warm.  Neurological:     Mental Status: He is alert.     Neurovascular status intact muscle strength was found to be adequate range of motion adequate.  Patient does have overall thick yellow brittle nailbeds consistent with fungus 2 through 5 bilateral with history of removal of the hallux bilateral and does have pain with ingrown components of the second and third nails left foot chronic     Assessment:  Chronic nail infection bilateral with ingrown painful second third left     Plan:  H&P reviewed both conditions and at this point discussed nail removal and educated him on removal and what could be considered.  He wants to hold off on this and today aggressive debridement of nailbeds 2-5 was accomplished bilateral advised this patient on the consideration for nail removal and he wants to get it done but will have to wait and the left ones will probably be the first to do.  Great deal time going over what would be required from a surgical perspective

## 2020-02-16 DIAGNOSIS — M9904 Segmental and somatic dysfunction of sacral region: Secondary | ICD-10-CM | POA: Diagnosis not present

## 2020-02-16 DIAGNOSIS — M5136 Other intervertebral disc degeneration, lumbar region: Secondary | ICD-10-CM | POA: Diagnosis not present

## 2020-02-16 DIAGNOSIS — M9902 Segmental and somatic dysfunction of thoracic region: Secondary | ICD-10-CM | POA: Diagnosis not present

## 2020-02-16 DIAGNOSIS — M62830 Muscle spasm of back: Secondary | ICD-10-CM | POA: Diagnosis not present

## 2020-02-24 DIAGNOSIS — M5136 Other intervertebral disc degeneration, lumbar region: Secondary | ICD-10-CM | POA: Diagnosis not present

## 2020-02-24 DIAGNOSIS — M9902 Segmental and somatic dysfunction of thoracic region: Secondary | ICD-10-CM | POA: Diagnosis not present

## 2020-02-24 DIAGNOSIS — M9904 Segmental and somatic dysfunction of sacral region: Secondary | ICD-10-CM | POA: Diagnosis not present

## 2020-02-24 DIAGNOSIS — M62830 Muscle spasm of back: Secondary | ICD-10-CM | POA: Diagnosis not present

## 2020-02-25 DIAGNOSIS — M19011 Primary osteoarthritis, right shoulder: Secondary | ICD-10-CM | POA: Diagnosis not present

## 2020-03-21 DIAGNOSIS — I10 Essential (primary) hypertension: Secondary | ICD-10-CM | POA: Diagnosis not present

## 2020-03-31 DIAGNOSIS — M19012 Primary osteoarthritis, left shoulder: Secondary | ICD-10-CM | POA: Diagnosis not present

## 2020-03-31 DIAGNOSIS — M545 Low back pain, unspecified: Secondary | ICD-10-CM | POA: Diagnosis not present

## 2020-04-29 DIAGNOSIS — U071 COVID-19: Secondary | ICD-10-CM

## 2020-04-29 HISTORY — DX: COVID-19: U07.1

## 2020-05-12 DIAGNOSIS — Z1159 Encounter for screening for other viral diseases: Secondary | ICD-10-CM | POA: Diagnosis not present

## 2020-05-17 DIAGNOSIS — K640 First degree hemorrhoids: Secondary | ICD-10-CM | POA: Diagnosis not present

## 2020-05-17 DIAGNOSIS — D124 Benign neoplasm of descending colon: Secondary | ICD-10-CM | POA: Diagnosis not present

## 2020-05-17 DIAGNOSIS — Z1211 Encounter for screening for malignant neoplasm of colon: Secondary | ICD-10-CM | POA: Diagnosis not present

## 2020-05-22 DIAGNOSIS — M19012 Primary osteoarthritis, left shoulder: Secondary | ICD-10-CM | POA: Diagnosis not present

## 2020-05-22 DIAGNOSIS — M545 Low back pain, unspecified: Secondary | ICD-10-CM | POA: Diagnosis not present

## 2020-05-23 DIAGNOSIS — D124 Benign neoplasm of descending colon: Secondary | ICD-10-CM | POA: Diagnosis not present

## 2020-06-08 DIAGNOSIS — M25651 Stiffness of right hip, not elsewhere classified: Secondary | ICD-10-CM | POA: Diagnosis not present

## 2020-06-08 DIAGNOSIS — M25652 Stiffness of left hip, not elsewhere classified: Secondary | ICD-10-CM | POA: Diagnosis not present

## 2020-07-05 DIAGNOSIS — H5213 Myopia, bilateral: Secondary | ICD-10-CM | POA: Diagnosis not present

## 2020-10-11 DIAGNOSIS — J45901 Unspecified asthma with (acute) exacerbation: Secondary | ICD-10-CM | POA: Diagnosis not present

## 2020-10-11 DIAGNOSIS — J069 Acute upper respiratory infection, unspecified: Secondary | ICD-10-CM | POA: Diagnosis not present

## 2020-10-11 DIAGNOSIS — Z03818 Encounter for observation for suspected exposure to other biological agents ruled out: Secondary | ICD-10-CM | POA: Diagnosis not present

## 2020-10-17 DIAGNOSIS — Z20822 Contact with and (suspected) exposure to covid-19: Secondary | ICD-10-CM | POA: Diagnosis not present

## 2021-01-05 DIAGNOSIS — I10 Essential (primary) hypertension: Secondary | ICD-10-CM | POA: Diagnosis not present

## 2021-01-05 DIAGNOSIS — R972 Elevated prostate specific antigen [PSA]: Secondary | ICD-10-CM | POA: Diagnosis not present

## 2021-01-05 DIAGNOSIS — E7800 Pure hypercholesterolemia, unspecified: Secondary | ICD-10-CM | POA: Diagnosis not present

## 2021-01-24 DIAGNOSIS — R001 Bradycardia, unspecified: Secondary | ICD-10-CM | POA: Diagnosis not present

## 2021-01-24 DIAGNOSIS — R55 Syncope and collapse: Secondary | ICD-10-CM | POA: Diagnosis not present

## 2021-01-24 DIAGNOSIS — R197 Diarrhea, unspecified: Secondary | ICD-10-CM | POA: Diagnosis not present

## 2021-03-01 DIAGNOSIS — R350 Frequency of micturition: Secondary | ICD-10-CM | POA: Diagnosis not present

## 2021-03-01 DIAGNOSIS — R972 Elevated prostate specific antigen [PSA]: Secondary | ICD-10-CM | POA: Diagnosis not present

## 2021-03-01 DIAGNOSIS — N401 Enlarged prostate with lower urinary tract symptoms: Secondary | ICD-10-CM | POA: Diagnosis not present

## 2021-04-02 DIAGNOSIS — M25551 Pain in right hip: Secondary | ICD-10-CM | POA: Diagnosis not present

## 2021-04-02 DIAGNOSIS — M19012 Primary osteoarthritis, left shoulder: Secondary | ICD-10-CM | POA: Diagnosis not present

## 2021-05-10 DIAGNOSIS — H04123 Dry eye syndrome of bilateral lacrimal glands: Secondary | ICD-10-CM | POA: Diagnosis not present

## 2021-05-10 DIAGNOSIS — H524 Presbyopia: Secondary | ICD-10-CM | POA: Diagnosis not present

## 2021-05-10 DIAGNOSIS — H35033 Hypertensive retinopathy, bilateral: Secondary | ICD-10-CM | POA: Diagnosis not present

## 2021-05-10 DIAGNOSIS — H25813 Combined forms of age-related cataract, bilateral: Secondary | ICD-10-CM | POA: Diagnosis not present

## 2021-05-10 DIAGNOSIS — H02831 Dermatochalasis of right upper eyelid: Secondary | ICD-10-CM | POA: Diagnosis not present

## 2021-06-29 DIAGNOSIS — M7541 Impingement syndrome of right shoulder: Secondary | ICD-10-CM | POA: Diagnosis not present

## 2021-07-06 DIAGNOSIS — M7541 Impingement syndrome of right shoulder: Secondary | ICD-10-CM | POA: Diagnosis not present

## 2021-07-17 DIAGNOSIS — M7541 Impingement syndrome of right shoulder: Secondary | ICD-10-CM | POA: Diagnosis not present

## 2021-08-02 DIAGNOSIS — R059 Cough, unspecified: Secondary | ICD-10-CM | POA: Diagnosis not present

## 2021-10-10 DIAGNOSIS — Z0100 Encounter for examination of eyes and vision without abnormal findings: Secondary | ICD-10-CM | POA: Diagnosis not present

## 2021-10-26 DIAGNOSIS — H10023 Other mucopurulent conjunctivitis, bilateral: Secondary | ICD-10-CM | POA: Diagnosis not present

## 2021-11-06 DIAGNOSIS — J029 Acute pharyngitis, unspecified: Secondary | ICD-10-CM | POA: Diagnosis not present

## 2021-11-14 DIAGNOSIS — H60332 Swimmer's ear, left ear: Secondary | ICD-10-CM | POA: Diagnosis not present

## 2021-11-14 DIAGNOSIS — I10 Essential (primary) hypertension: Secondary | ICD-10-CM | POA: Diagnosis not present

## 2021-11-14 DIAGNOSIS — H6501 Acute serous otitis media, right ear: Secondary | ICD-10-CM | POA: Diagnosis not present

## 2021-11-14 DIAGNOSIS — H9201 Otalgia, right ear: Secondary | ICD-10-CM | POA: Diagnosis not present

## 2021-12-08 IMAGING — DX DG HIP (WITH OR WITHOUT PELVIS) 1V*L*
1 series · 1 of 1 positions shown · non-contrast
Comparison: 02/22/2019

CLINICAL DATA: Prior left hip replacement

EXAM:
DG HIP (WITH OR WITHOUT PELVIS) 1V*L*

[hip lat]
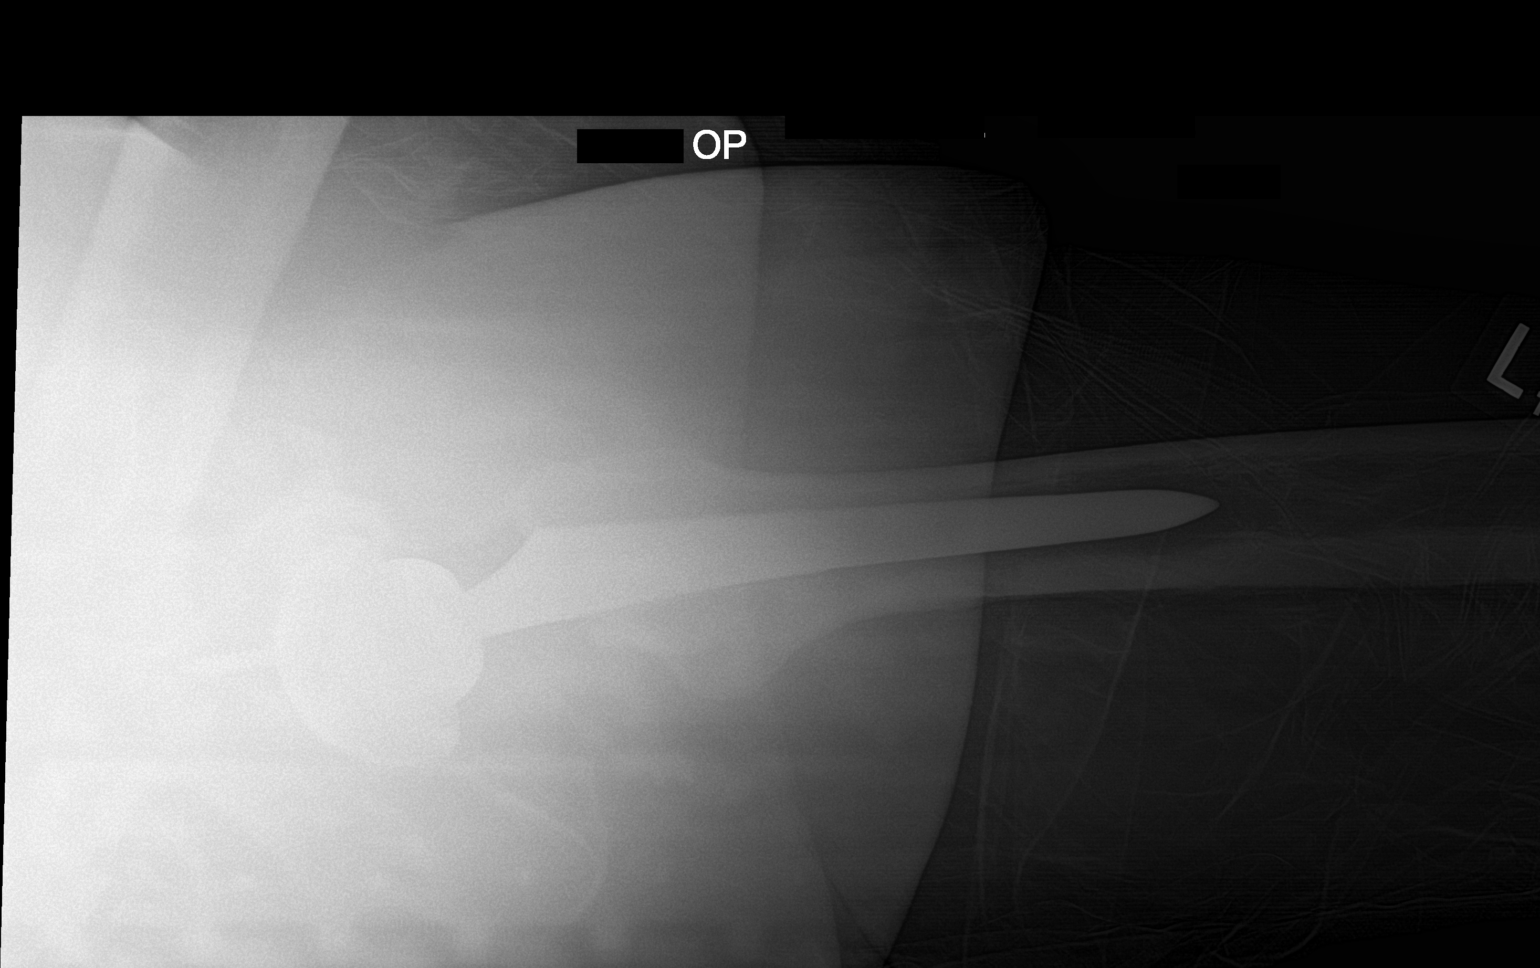

[1 of 1 positions shown; findings below may reference images not displayed]

FINDINGS: Changes of left hip replacement. Normal alignment. No hardware bony
complicating feature.
IMPRESSION: Left hip replacement.  No visible complicating feature.

## 2022-05-20 DIAGNOSIS — M19012 Primary osteoarthritis, left shoulder: Secondary | ICD-10-CM | POA: Diagnosis not present

## 2022-05-20 DIAGNOSIS — M7712 Lateral epicondylitis, left elbow: Secondary | ICD-10-CM | POA: Diagnosis not present

## 2022-06-21 DIAGNOSIS — M19022 Primary osteoarthritis, left elbow: Secondary | ICD-10-CM | POA: Diagnosis not present

## 2022-06-21 DIAGNOSIS — M25561 Pain in right knee: Secondary | ICD-10-CM | POA: Diagnosis not present

## 2022-07-19 DIAGNOSIS — M19022 Primary osteoarthritis, left elbow: Secondary | ICD-10-CM | POA: Diagnosis not present

## 2022-08-02 DIAGNOSIS — H524 Presbyopia: Secondary | ICD-10-CM | POA: Diagnosis not present

## 2022-08-02 DIAGNOSIS — H5213 Myopia, bilateral: Secondary | ICD-10-CM | POA: Diagnosis not present

## 2022-08-17 IMAGING — DX DG PORTABLE PELVIS
1 series · 1 of 1 positions shown · non-contrast
Comparison: Concurrent right hip exam. Prior pelvis radiograph
03/14/2019

CLINICAL DATA: Right hip arthroplasty.

EXAM:
PORTABLE PELVIS 1-2 VIEWS

[pelvis ap]
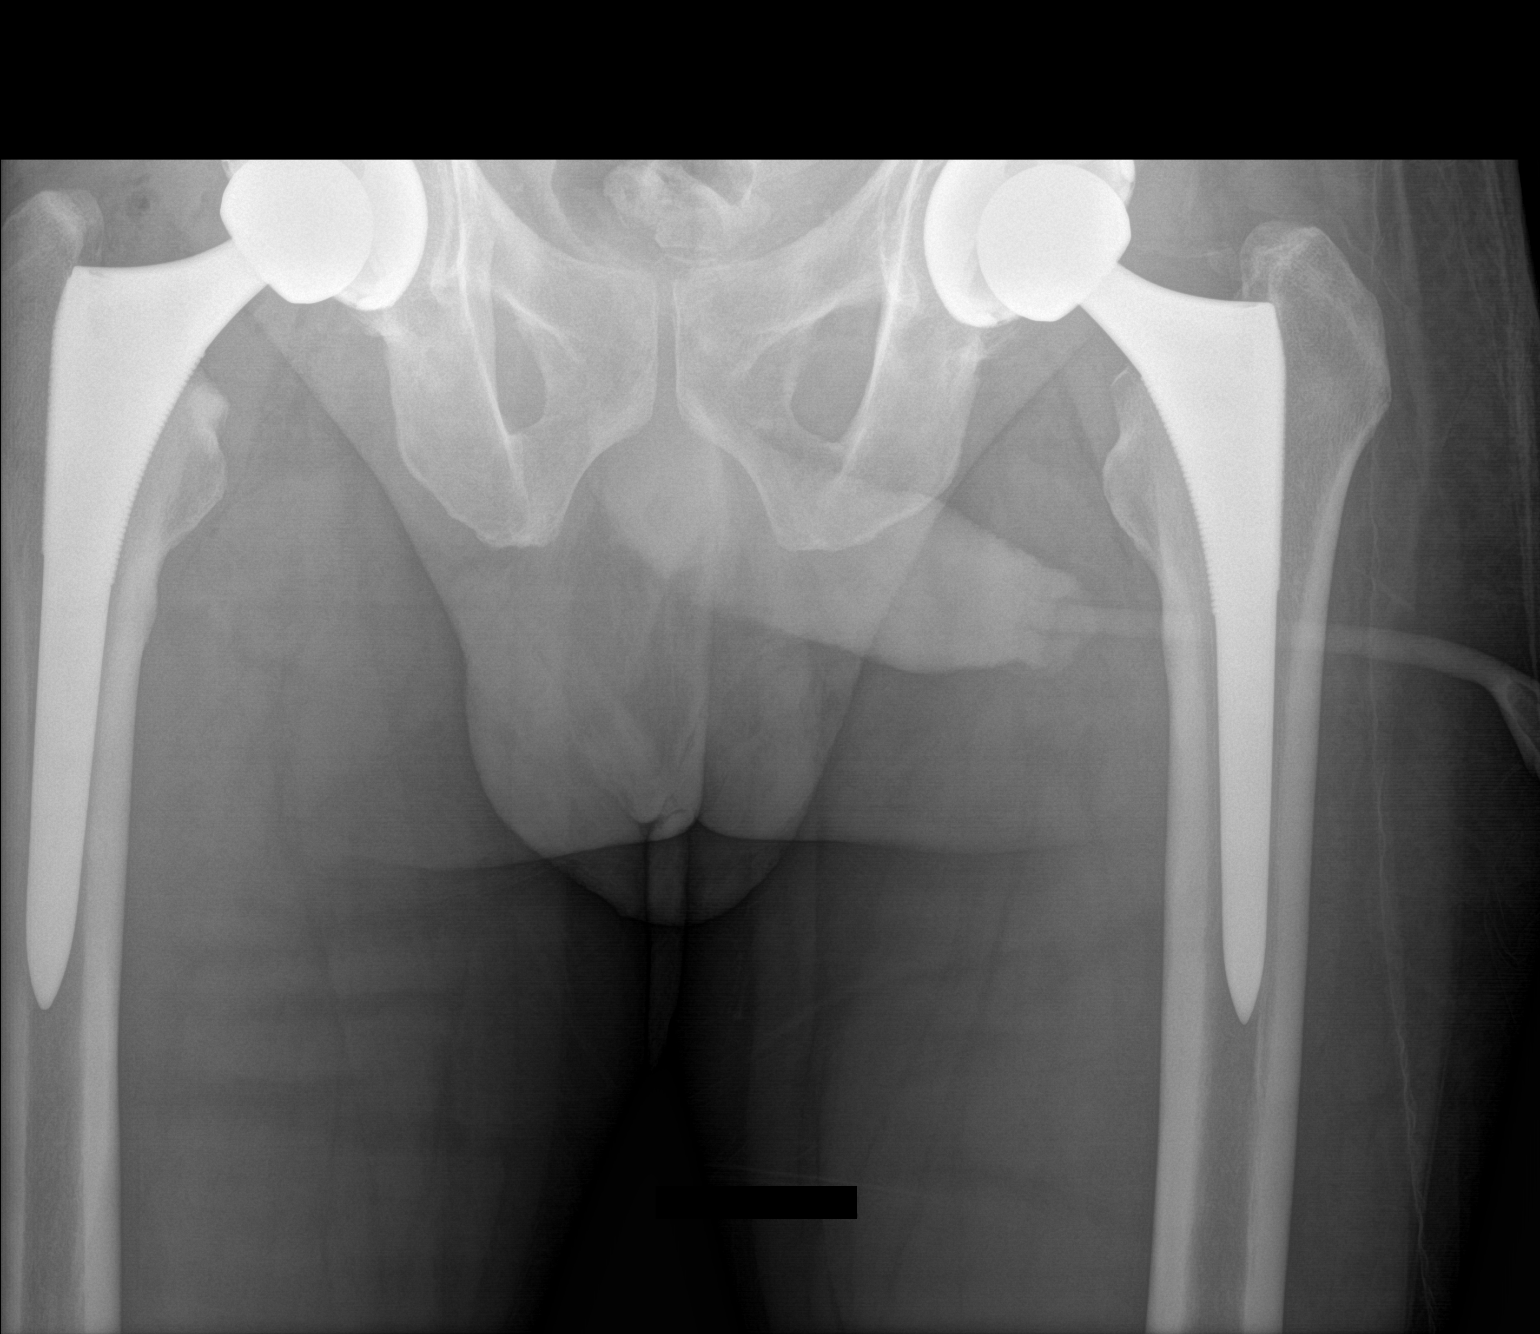

[1 of 1 positions shown; findings below may reference images not displayed]

FINDINGS: New right hip arthroplasty in expected alignment. Femoral stem is
midline. Recent postsurgical change includes air and edema in the
soft tissues and joint space. Left hip arthroplasty is unchanged
without complication.
IMPRESSION: New right hip arthroplasty without immediate postoperative
complication.

## 2022-08-17 IMAGING — DX DG HIP (WITH OR WITHOUT PELVIS) 1V PORT*R*
2 series · 2 of 2 positions shown · non-contrast
Comparison: Concurrent pelvis radiograph.

CLINICAL DATA: Right hip arthroplasty.

EXAM:
DG HIP (WITH OR WITHOUT PELVIS) 1V PORT RIGHT

[hip ap]
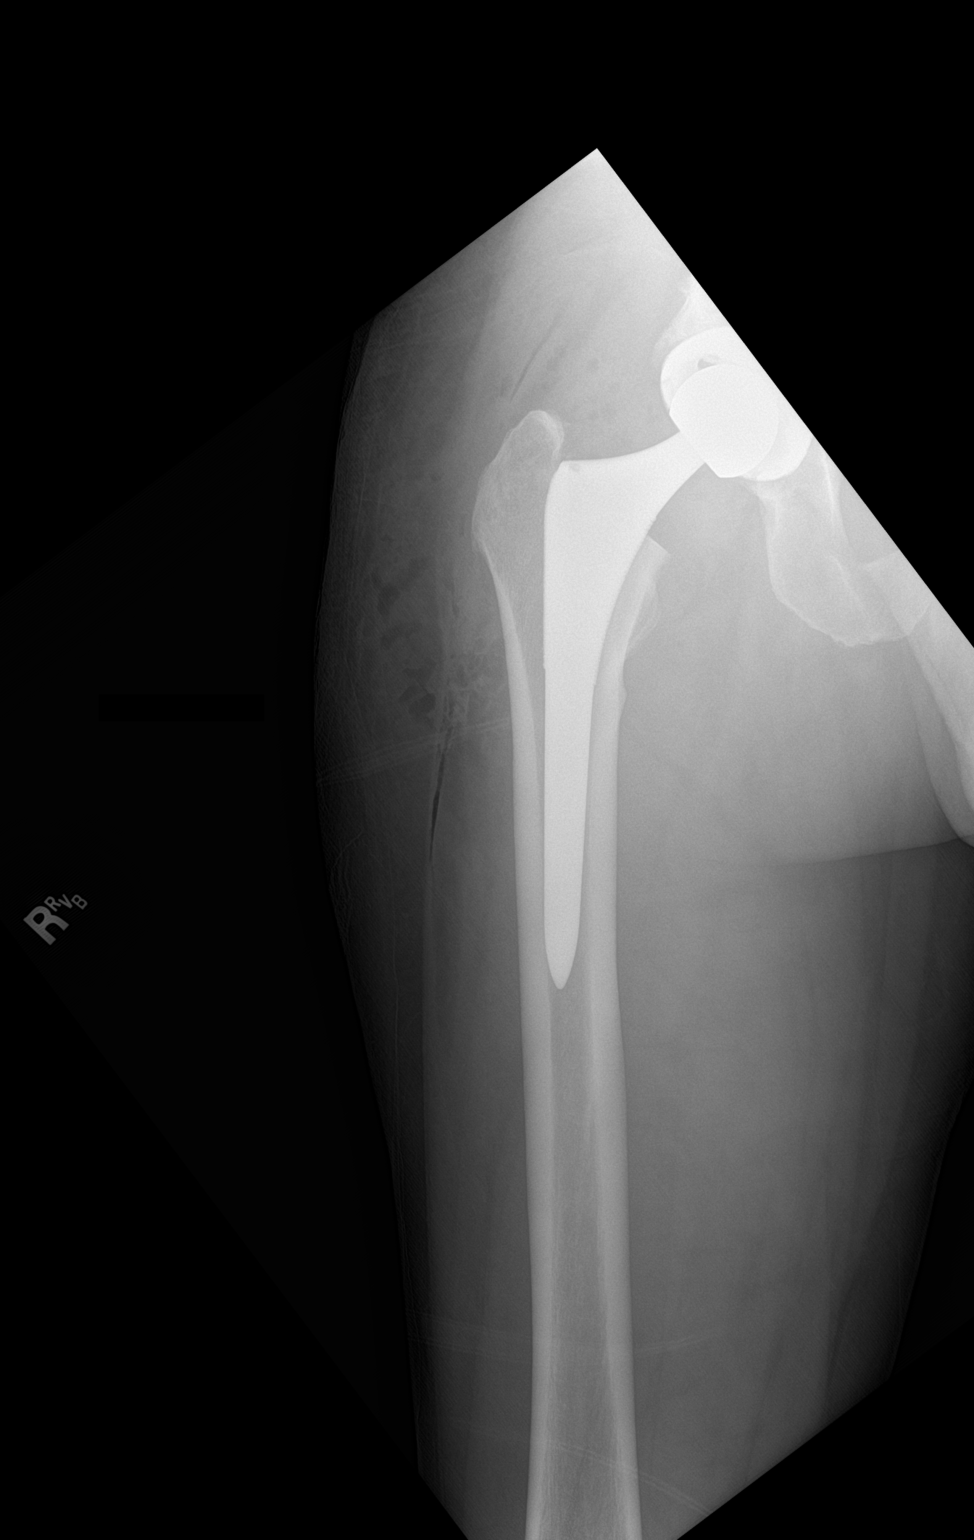

[hip frog leg]
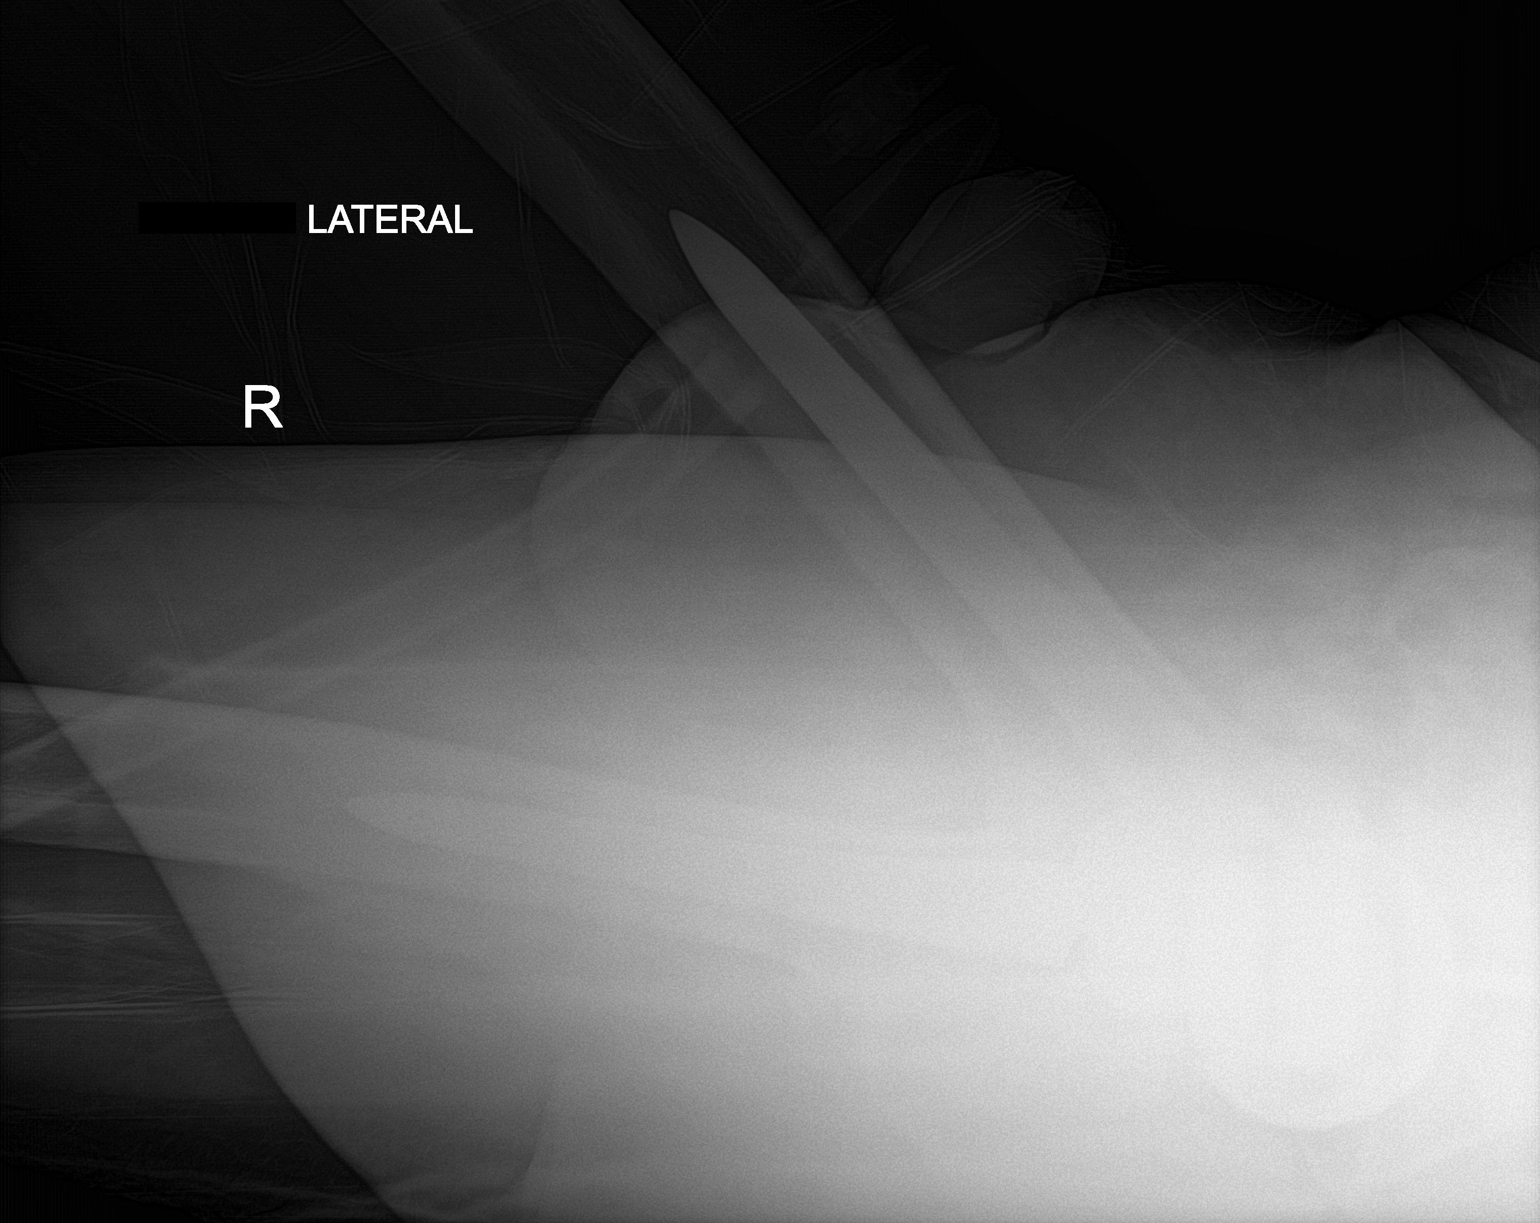

[2 of 2 positions shown; findings below may reference images not displayed]

FINDINGS: Right hip arthroplasty in expected alignment. The femoral stem is
midline. There is no periprosthetic lucency or fracture. Acetabular
cup is fully demonstrated on the concurrent pelvis exam. Recent
postsurgical change includes air and edema in the soft tissues and
joint space.
IMPRESSION: Right hip arthroplasty without immediate postoperative complication.

## 2022-09-13 DIAGNOSIS — H60311 Diffuse otitis externa, right ear: Secondary | ICD-10-CM | POA: Diagnosis not present

## 2022-09-25 DIAGNOSIS — M25561 Pain in right knee: Secondary | ICD-10-CM | POA: Diagnosis not present

## 2022-09-25 DIAGNOSIS — M19022 Primary osteoarthritis, left elbow: Secondary | ICD-10-CM | POA: Diagnosis not present

## 2022-10-08 DIAGNOSIS — M25561 Pain in right knee: Secondary | ICD-10-CM | POA: Diagnosis not present

## 2022-10-25 DIAGNOSIS — M25561 Pain in right knee: Secondary | ICD-10-CM | POA: Diagnosis not present

## 2022-11-06 DIAGNOSIS — M1711 Unilateral primary osteoarthritis, right knee: Secondary | ICD-10-CM | POA: Diagnosis not present

## 2022-11-13 DIAGNOSIS — M1711 Unilateral primary osteoarthritis, right knee: Secondary | ICD-10-CM | POA: Diagnosis not present

## 2022-11-20 DIAGNOSIS — M1711 Unilateral primary osteoarthritis, right knee: Secondary | ICD-10-CM | POA: Diagnosis not present

## 2022-11-22 DIAGNOSIS — U071 COVID-19: Secondary | ICD-10-CM | POA: Diagnosis not present

## 2022-11-22 DIAGNOSIS — R52 Pain, unspecified: Secondary | ICD-10-CM | POA: Diagnosis not present

## 2022-11-22 DIAGNOSIS — R5383 Other fatigue: Secondary | ICD-10-CM | POA: Diagnosis not present

## 2022-11-22 DIAGNOSIS — R509 Fever, unspecified: Secondary | ICD-10-CM | POA: Diagnosis not present

## 2022-11-22 DIAGNOSIS — R6883 Chills (without fever): Secondary | ICD-10-CM | POA: Diagnosis not present

## 2022-12-04 DIAGNOSIS — K921 Melena: Secondary | ICD-10-CM | POA: Diagnosis not present

## 2022-12-04 DIAGNOSIS — I10 Essential (primary) hypertension: Secondary | ICD-10-CM | POA: Diagnosis not present

## 2022-12-04 DIAGNOSIS — E7800 Pure hypercholesterolemia, unspecified: Secondary | ICD-10-CM | POA: Diagnosis not present

## 2022-12-04 DIAGNOSIS — R972 Elevated prostate specific antigen [PSA]: Secondary | ICD-10-CM | POA: Diagnosis not present

## 2023-01-03 DIAGNOSIS — H35373 Puckering of macula, bilateral: Secondary | ICD-10-CM | POA: Diagnosis not present

## 2023-05-15 DIAGNOSIS — Z0000 Encounter for general adult medical examination without abnormal findings: Secondary | ICD-10-CM | POA: Diagnosis not present

## 2023-05-15 DIAGNOSIS — E7800 Pure hypercholesterolemia, unspecified: Secondary | ICD-10-CM | POA: Diagnosis not present

## 2023-05-15 DIAGNOSIS — R109 Unspecified abdominal pain: Secondary | ICD-10-CM | POA: Diagnosis not present

## 2023-05-15 DIAGNOSIS — R972 Elevated prostate specific antigen [PSA]: Secondary | ICD-10-CM | POA: Diagnosis not present

## 2023-05-15 DIAGNOSIS — I10 Essential (primary) hypertension: Secondary | ICD-10-CM | POA: Diagnosis not present

## 2023-05-15 DIAGNOSIS — R002 Palpitations: Secondary | ICD-10-CM | POA: Diagnosis not present

## 2023-05-20 DIAGNOSIS — R109 Unspecified abdominal pain: Secondary | ICD-10-CM | POA: Diagnosis not present

## 2023-05-21 DIAGNOSIS — J069 Acute upper respiratory infection, unspecified: Secondary | ICD-10-CM | POA: Diagnosis not present

## 2023-05-21 DIAGNOSIS — J45901 Unspecified asthma with (acute) exacerbation: Secondary | ICD-10-CM | POA: Diagnosis not present

## 2023-05-21 DIAGNOSIS — Z03818 Encounter for observation for suspected exposure to other biological agents ruled out: Secondary | ICD-10-CM | POA: Diagnosis not present

## 2023-09-17 DIAGNOSIS — K080 Exfoliation of teeth due to systemic causes: Secondary | ICD-10-CM | POA: Diagnosis not present

## 2023-10-01 DIAGNOSIS — H524 Presbyopia: Secondary | ICD-10-CM | POA: Diagnosis not present

## 2023-11-13 ENCOUNTER — Emergency Department (HOSPITAL_BASED_OUTPATIENT_CLINIC_OR_DEPARTMENT_OTHER)
Admission: EM | Admit: 2023-11-13 | Discharge: 2023-11-13 | Disposition: A | Discharge status: Home / Self Care | Attending: Emergency Medicine | Admitting: Emergency Medicine

## 2023-11-13 ENCOUNTER — Emergency Department (HOSPITAL_BASED_OUTPATIENT_CLINIC_OR_DEPARTMENT_OTHER)

## 2023-11-13 ENCOUNTER — Other Ambulatory Visit: Payer: Self-pay

## 2023-11-13 ENCOUNTER — Emergency Department (HOSPITAL_BASED_OUTPATIENT_CLINIC_OR_DEPARTMENT_OTHER): Admitting: Radiology

## 2023-11-13 DIAGNOSIS — R7989 Other specified abnormal findings of blood chemistry: Secondary | ICD-10-CM | POA: Insufficient documentation

## 2023-11-13 DIAGNOSIS — R0602 Shortness of breath: Secondary | ICD-10-CM | POA: Diagnosis not present

## 2023-11-13 DIAGNOSIS — Z79899 Other long term (current) drug therapy: Secondary | ICD-10-CM | POA: Diagnosis not present

## 2023-11-13 DIAGNOSIS — R001 Bradycardia, unspecified: Secondary | ICD-10-CM | POA: Diagnosis not present

## 2023-11-13 DIAGNOSIS — Z7982 Long term (current) use of aspirin: Secondary | ICD-10-CM | POA: Diagnosis not present

## 2023-11-13 DIAGNOSIS — I959 Hypotension, unspecified: Secondary | ICD-10-CM | POA: Diagnosis not present

## 2023-11-13 DIAGNOSIS — R42 Dizziness and giddiness: Secondary | ICD-10-CM | POA: Diagnosis not present

## 2023-11-13 DIAGNOSIS — R55 Syncope and collapse: Secondary | ICD-10-CM | POA: Insufficient documentation

## 2023-11-13 DIAGNOSIS — I10 Essential (primary) hypertension: Secondary | ICD-10-CM | POA: Diagnosis not present

## 2023-11-13 LAB — COMPREHENSIVE METABOLIC PANEL WITH GFR
ALT: 12 U/L (ref 0–44)
AST: 18 U/L (ref 15–41)
Albumin: 4.1 g/dL (ref 3.5–5.0)
Alkaline Phosphatase: 76 U/L (ref 38–126)
Anion gap: 9 (ref 5–15)
BUN: 17 mg/dL (ref 8–23)
CO2: 26 mmol/L (ref 22–32)
Calcium: 9.5 mg/dL (ref 8.9–10.3)
Chloride: 108 mmol/L (ref 98–111)
Creatinine, Ser: 1.41 mg/dL — ABNORMAL HIGH (ref 0.61–1.24)
GFR, Estimated: 54 mL/min — ABNORMAL LOW (ref >60)
Glucose, Bld: 141 mg/dL — ABNORMAL HIGH (ref 70–99)
Potassium: 3.8 mmol/L (ref 3.5–5.1)
Sodium: 143 mmol/L (ref 135–145)
Total Bilirubin: 0.5 mg/dL (ref 0.0–1.2)
Total Protein: 6.5 g/dL (ref 6.5–8.1)

## 2023-11-13 LAB — CBC
HCT: 41.7 % (ref 39.0–52.0)
Hemoglobin: 13.6 g/dL (ref 13.0–17.0)
MCH: 29.7 pg (ref 26.0–34.0)
MCHC: 32.6 g/dL (ref 30.0–36.0)
MCV: 91 fL (ref 80.0–100.0)
Platelets: 205 K/uL (ref 150–400)
RBC: 4.58 MIL/uL (ref 4.22–5.81)
RDW: 13.6 % (ref 11.5–15.5)
WBC: 5.1 K/uL (ref 4.0–10.5)
nRBC: 0 % (ref 0.0–0.2)

## 2023-11-13 LAB — TROPONIN T, HIGH SENSITIVITY
Troponin T High Sensitivity: <15 ng/L (ref <19)
Troponin T High Sensitivity: 26 ng/L — ABNORMAL HIGH (ref <19)
Troponin T High Sensitivity: 20 ng/L — ABNORMAL HIGH (ref <19)

## 2023-11-13 NOTE — ED Notes (Signed)
 Stroke scale and swallow screen just entered were erroneously placed on this chart.

## 2023-11-13 NOTE — Discharge Instructions (Signed)
 As discussed, recommend close follow-up with your primary care for reassessment.  Monitor blood pressure at home and follow-up with primary care regarding results.

## 2023-11-13 NOTE — ED Provider Notes (Signed)
 [place] EMERGENCY DEPARTMENT AT [facility] Provider Note   CSN: [id number] Arrival date & time: 11/13/23  9044     Patient presents with: Near Syncope   David Reed is a 68 y.o. male.    Near Syncope   68 year old male presents emergency department with episode of lightheadedness, shortness of breath and feeling as if he going to pass out.  States that he woke up this morning with a burning sensation to his right anterior elbow and a red raised area later.  States that he felt he was stung or bit by an insect.  Stood up to go to the restroom to see what his arm looked like when he began to feel lightheaded, generally weak, short of breath and experienced chest pain.  States that he subsequently sat down on the edge of the bed and then subsequently lowered himself to the floor.  States he was having some central chest discomfort as well described as pressure without radiation.  Denies any nausea, vomiting. Called EMS and upon arrival, had blood pressure 100/40 which improved with IV fluids.  Patient states that his symptoms have somewhat improved since then as well but still feels chills and feelings of being cold.  Patient is on blood pressure medication with no recent changes in his medication per patient.  Past medical history significant for hypertension, degenerative disc disease, arthritis, allergy  Prior to Admission medications   Medication Sig Start Date End Date Taking? Authorizing Provider  Ascorbic Acid  (VITAMIN C) 500 MG CAPS Take 500 mg by mouth daily.     [provider]  aspirin  EC 325 MG tablet Take 1 tablet (325 mg total) by mouth 2 (two) times daily. 04/13/19   Brown, Blaine K, PA-C  baclofen  (LIORESAL ) 10 MG tablet Take 1 tablet (10 mg total) by mouth 3 (three) times daily. As needed for muscle spasm 04/13/19   Brown, Blaine K, PA-C  baclofen  (LIORESAL ) 10 MG tablet Take 1 tablet (10 mg total) by mouth 3 (three) times daily. As needed for muscle  spasm 12/22/19   Brown, Blaine K, PA-C  cetirizine (ZYRTEC) 10 MG tablet Take 10 mg by mouth daily.    [provider]  Chromium  1000 MCG TABS Take 1,000 mcg by mouth daily.    [provider]  Digestive Enzymes (DIGESTIVE ENZYME PO) Take 2 capsules by mouth 2 (two) times daily before a meal. Primagest Digestive Enzymes Nutritional Supplement     [provider]  FIBER PO Take 1 Scoop by mouth daily.     [provider]  hydrochlorothiazide  (HYDRODIURIL ) 50 MG tablet Take 100 mg by mouth daily.     [provider]  HYDROcodone -acetaminophen  (NORCO) 10-325 MG tablet Take 1 tablet by mouth every 4 (four) hours as needed. 12/22/19   Brown, Blaine K, PA-C  losartan  (COZAAR ) 100 MG tablet Take 100 mg by mouth daily.    [provider]  Multiple Vitamin (MULTIVITAMIN WITH MINERALS) TABS tablet Take 1 tablet by mouth daily.     [provider]  NON FORMULARY Take 1-2 capsules by mouth 2 (two) times daily as needed (gas).     [provider]  Omega 3 1200 MG CAPS Take 1,200 mg by mouth daily.     [provider]  ondansetron  (ZOFRAN ) 4 MG tablet Take 1 tablet (4 mg total) by mouth every 8 (eight) hours as needed for nausea or vomiting. 04/13/19   Brown, Blaine K, PA-C  oxyCODONE  (ROXICODONE ) 5  MG immediate release tablet Take 1 tablet (5 mg total) by mouth every 4 (four) hours as needed for severe pain. 04/13/19   Brown, Blaine K, PA-C  Protease POWD Take 1 capsule by mouth daily. Protease Enzyme    [provider]  sennosides-docusate sodium  (SENOKOT-S) 8.6-50 MG tablet Take 2 tablets by mouth daily. 04/13/19   Brown, Blaine K, PA-C    Allergies: Prednisone    Review of Systems  Cardiovascular:  Positive for near-syncope.  All other systems reviewed and are negative.   Updated Vital Signs BP (!) 154/74   Pulse (!) 51   Temp 97.7 F (36.5 C) (Oral)   Resp 15   SpO2 100%   Physical Exam Vitals and nursing  note reviewed.  Constitutional:      General: He is not in acute distress.    Appearance: He is well-developed.  HENT:     Head: Normocephalic and atraumatic.  Eyes:     Conjunctiva/sclera: Conjunctivae normal.  Cardiovascular:     Rate and Rhythm: Normal rate and regular rhythm.  Pulmonary:     Effort: Pulmonary effort is normal. No respiratory distress.     Breath sounds: Normal breath sounds. No wheezing, rhonchi or rales.  Abdominal:     Palpations: Abdomen is soft.     Tenderness: There is no abdominal tenderness.  Musculoskeletal:        General: No swelling.     Cervical back: Neck supple.     Right lower leg: No edema.     Left lower leg: No edema.  Skin:    General: Skin is warm and dry.     Capillary Refill: Capillary refill takes less than 2 seconds.  Neurological:     Mental Status: He is alert.  Psychiatric:        Mood and Affect: Mood normal.     (all labs ordered are listed, but only abnormal results are displayed) Labs Reviewed  COMPREHENSIVE METABOLIC PANEL WITH GFR - Abnormal; Notable for the following components:      Result Value   Glucose, Bld 141 (*)    Creatinine, Ser 1.41 (*)    GFR, Estimated 54 (*)    All other components within normal limits  CBC  CBG MONITORING, ED  TROPONIN T, HIGH SENSITIVITY    EKG: EKG Interpretation Date/Time:  Thursday November 13 2023 10:12:54 EDT Ventricular Rate:  50 PR Interval:  216 QRS Duration:  95 QT Interval:  451 QTC Calculation: 412 R Axis:   21  Text Interpretation: Sinus rhythm Borderline prolonged PR interval Borderline T abnormalities, inferior leads No significant change since last tracing Confirmed by Zackowski, Scott [phone]) on 11/13/2023 10:21:23 AM  Radiology: No results found.   Procedures   Medications Ordered in the ED - No data to display  Clinical Course as of 11/13/23 1457  Thu Nov 13, 2023  1405 Consulted Dr. Sheena for cardiology.  Recommended admission to the hospitalist,  serial troponins [CR]  1443 Discussion was had with patient regarding cardiologist recommendation for admission and he did not want to.  States that he would prefer to follow-up with his primary care.  Discussion was had regarding obtaining a third troponin while in the ED before patient decided to leave for continued trending.  Patient willing to stay.  Patient currently chest pain-free. [CR]    Clinical Course User Index [CR] Silver Wonda LABOR, [place]  Medical Decision Making Amount and/or Complexity of Data Reviewed Labs: ordered. Radiology: ordered.   This patient presents to the ED for concern of near syncope, this involves an extensive number of treatment options, and is a complaint that carries with it a high risk of complications and morbidity.  The differential diagnosis includes dehydration, orthostatic hypotension, vasovagal, ACS, PE, arrhythmia, metabolic derangement, pneumonia, other infectious etiology, other   Co morbidities that complicate the patient evaluation  See HPI   Additional history obtained:  Additional history obtained from EMR External records from outside source obtained and reviewed including hospital records   Lab Tests:  I Ordered, and personally interpreted labs.  The pertinent results include: No leukocytosis.  No evidence of anemia.  Platelets within range.  No Electra abnormalities.  Patient with creatinine elevation of 1.41, BUN of 17; elevated from blood work done 4 years ago.  No transaminitis.  Troponin first less than 15, delta 26 delta 20   Imaging Studies ordered:  I ordered imaging studies including chest x-ray I independently visualized and interpreted imaging which showed no acute cardiopulmonary abnormality I agree with the radiologist interpretation   Cardiac Monitoring: / EKG:  The patient was maintained on a cardiac monitor.  I personally viewed and interpreted the cardiac monitored which showed an  underlying rhythm of: Sinus rhythm.  Borderline prolonged PR interval.  Nonspecific T wave changes inferior leads.  No obvious acute ischemic change from prior EKG performed 4 years ago.  Consultations Obtained:  N/a   Problem List / ED Course / Critical interventions / Medication management  Near syncope, elevated troponin Reevaluation of the patient after these medicines showed that the patient stayed the same I have reviewed the patients home medicines and have made adjustments as needed   Social Determinants of Health:  Denies tobacco, licit drug use.   Test / Admission - Considered:  Near syncope, elevated troponin Vitals signs significant for hypertension blood pressure 154/74. Otherwise within normal range and stable throughout visit. Laboratory/imaging studies significant for: See above 68 year old male presents emergency department with episode of lightheadedness, shortness of breath and feeling as if he going to pass out.  States that he woke up this morning with a burning sensation to his right anterior elbow and a red raised area later.  States that he felt he was stung or bit by an insect.  Stood up to go to the restroom to see what his arm looked like when he began to feel lightheaded, generally weak, short of breath.  States that he subsequently sat down on the edge of the bed and then subsequently lowered himself to the floor.  States he was having some central chest discomfort as well described as pressure without radiation.  Denies any nausea, vomiting. Called EMS and upon arrival, had blood pressure 100/40 which improved with IV fluids.  Patient states that his symptoms have somewhat improved since then as well but still feels chills and feelings of being cold.  Patient is on blood pressure medication with no recent changes in his medication per patient. On exam, nonfocal neurologic exam.  Lungs clear to auscultation bilaterally.  No abdominal tenderness.  Patient did note  improvement of symptoms after IV fluids administered by EMS no longer hypotensive rather hypertensive in the ED.  Patient's lab did show a creatinine elevation from around 1-1.41 other labs a few years ago.  Given patient's concurrent chest pain, shortness of breath with episode earlier today, troponin was obtained which did show initial  delta positive with a third negative.  Initially consulted cardiology regarding the delta change in between the 1st and 2nd troponin who recommended admission for observation.  Patient hesitant regarding admission as he feels much better after his blood pressure was corrected with IV fluids no longer symptomatic.  Patient agreed to stay for third troponin which was negative of 20.  Symptoms do seem more consistent with orthostatic hypotension feelings of lightheadedness/shortness of breath/chest discomfort occurring in the setting of positional changes with blood pressure reading initially being in the 100s over 40s per EMS.  No evidence of arrhythmia on EKG or cardiac monitoring.  Patient adamant that he does not want to stay in the ER.  Discussion was had with patient regarding cardiologist recommendations and increased risks of potential morbidity/mortality he acknowledged understanding was still would prefer to follow-up in the outpatient setting.  Will recommend follow-up with primary care/cardiology in the outpatient setting for continued evaluation.  Treatment plan discussed with patient and he acknowledged understanding was agreeable to said plan.  Patient well-appearing, afebrile in no acute distress. Worrisome signs and symptoms were discussed with the patient, and the patient acknowledged understanding to return to the ED if noticed. Patient was stable upon discharge.       Final diagnoses:  None    ED Discharge Orders     None          Silver Wonda LABOR, [place] 11/14/23 2338    Geraldene Hamilton, MD 11/17/23 [phone]

## 2023-11-13 NOTE — ED Notes (Signed)
 He is alert and has eaten lunch, which his wife had purchased for him. He now denies having any shaking or chills like I had when I first got here.

## 2023-11-13 NOTE — ED Triage Notes (Signed)
 Pt arrived via [facility] from home c/o near syncope, pt reports weakness when sitting on the edge of the bed, no fall or trauma. EMS reports initial BP 100/40, increased to 139/63 50 HR, 96% SpO2 RA, CBG 166. 500 mL NS admin by EMS.

## 2023-11-17 DIAGNOSIS — R55 Syncope and collapse: Secondary | ICD-10-CM | POA: Diagnosis not present

## 2023-11-17 DIAGNOSIS — I10 Essential (primary) hypertension: Secondary | ICD-10-CM | POA: Diagnosis not present

## 2023-11-17 DIAGNOSIS — W57XXXA Bitten or stung by nonvenomous insect and other nonvenomous arthropods, initial encounter: Secondary | ICD-10-CM | POA: Diagnosis not present

## 2023-11-20 DIAGNOSIS — J4520 Mild intermittent asthma, uncomplicated: Secondary | ICD-10-CM | POA: Diagnosis not present

## 2023-11-20 DIAGNOSIS — H1045 Other chronic allergic conjunctivitis: Secondary | ICD-10-CM | POA: Diagnosis not present

## 2023-11-20 DIAGNOSIS — Z91038 Other insect allergy status: Secondary | ICD-10-CM | POA: Diagnosis not present

## 2023-11-20 DIAGNOSIS — J309 Allergic rhinitis, unspecified: Secondary | ICD-10-CM | POA: Diagnosis not present

## 2023-11-26 DIAGNOSIS — Z91038 Other insect allergy status: Secondary | ICD-10-CM | POA: Diagnosis not present

## 2023-12-02 DIAGNOSIS — R350 Frequency of micturition: Secondary | ICD-10-CM | POA: Diagnosis not present

## 2023-12-02 DIAGNOSIS — I10 Essential (primary) hypertension: Secondary | ICD-10-CM | POA: Diagnosis not present

## 2023-12-02 DIAGNOSIS — T63481A Toxic effect of venom of other arthropod, accidental (unintentional), initial encounter: Secondary | ICD-10-CM | POA: Diagnosis not present

## 2023-12-02 DIAGNOSIS — R972 Elevated prostate specific antigen [PSA]: Secondary | ICD-10-CM | POA: Diagnosis not present

## 2023-12-15 ENCOUNTER — Ambulatory Visit: Admitting: Cardiovascular Disease

## 2023-12-15 NOTE — Progress Notes (Unsigned)
 Cardiology Office Note   Date:  12/15/2023  ID:  David Reed, DOB Nov 19, 1955, MRN [id number] PCP: Rexanne Ingle, MD  [facility] Providers Cardiologist:  None   History of Present Illness David Reed is a 68 y.o. male With past medical history of hypertension, BPH, asthma, hyperlipidemia.  Patient presents today for evaluation of syncope.  Patient was seen in the ER on 7/17.  Reported having an episode of lightheadedness, shortness of breath.  He had woken up with a burning sensation to his right anterior elbow and a red raised area.  Stood up to go to the bathroom to see what his arm looked like and he felt lightheaded, weak.  He sat down on the edge of the bed and lowered himself to the floor.  Did describe some central chest discomfort as well.  Called EMS, BP 100/40 on arrival which improved with IV fluids. EKG showed sinus bradycardia with heart rate 52 bpm, T wave inversion in lead III. hsTn <15>26>20.  Chest x-ray showed no acute cardiopulmonary abnormality.  Creatinine 1.41.  On interview, patient reports that on 7/17, he woke up with pain in his right elbow. Felt like something had bit him. He ran to the bathroom to look in the mirror and walked back to bed. He started to feel lightheaded so he sat down. His wife called Ems. He believes he passed out because the next thing he remembers was his wife and EMS standing over him. He denies any palpitations, chest pain, or shortness of breath during the episode. He has not had recurrence of syncope or near syncope since then. Denies dizziness upon standing. He does occasionally have brief palpitations/fluttering feeling in his chest. He reports that his BP is always a bit elevated. His PCP has tried to adjust his BP medications before and patient became frustrated with the changes. He is not interested in making any medication changes at this time. 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 No chest pain or dyspnea with  exertion.   Studies Reviewed  Risk Assessment/Calculations        Physical Exam VS:  There were no vitals taken for this visit.       Wt Readings from Last 3 Encounters:  12/21/19 221 lb 5.5 oz (100.4 kg)  12/14/19 221 lb 7 oz (100.4 kg)  04/13/19 217 lb (98.4 kg)    GEN: Well nourished, well developed in no acute distress. Sitting comfortably on the exam table  NECK: No JVD; No carotid bruits CARDIAC:  RRR, no murmurs, rubs, gallops. Radial pulses 2+ bilaterally  RESPIRATORY:  Clear to auscultation without rales, wheezing or rhonchi. Normal WOB on room air   ABDOMEN: Soft, non-tender, non-distended EXTREMITIES:  No edema in BLE; No deformity   ASSESSMENT AND PLAN  Syncope  - Patient had been seen in the ED on 7/17 after he had an episode of syncope.  He woke up with burning feeling in his right elbow, stood up to go to the bathroom to see what his arm looked like, started to feel lightheaded and weak.  Sat down the edge of bed and lowered himself to the floor before losing consciousness. With EMS, BP 100/40.  High-sensitivity troponin 15, 26, 20.  Chest x-ray showed no acute cardiopulmonary abnormality.  Creatinine elevated to 1.41 - Patient has not had recurrence of syncope since 7/17. No chest pain or DOE. No dizziness upon standing. He reports good hydration and oral intake. Stays active by working  as a Public affairs consultant and working out at [employer]  - With elevated creatinine in the ED, possible patient had orthostatic hypotension in the setting of dehydration.  - Ordered BPM  - No recent symptoms or orthostatic hypotension  - Ordered 14 day zio  - Ordered echocardiogram  - Instructed patient to stay hydrated and make position changes slowly to prevent syncope  -I did discuss the [place] DMV medical guidelines for driving: it is prudent to recommend that all persons should be free of syncopal episodes for at least six months to be granted the driving privilege. (THE [place]   PHYSICIAN'S GUIDE TO DRIVER MEDICAL EVALUATION, Second Edition, Medical Review Branch, Associate Professor, Division of [employer], [place]  Department of Transportation, July 2004)   HTN  - BP elevated today in clinic, 146/88. Patient reports that in the past when his BP medications were increased, his blood pressure dropped too low. He is uninterested in making BP medication changes at this time  - Continue amlodipine 5 mg daily, losartan  100 mg daily  HLD  - LDL 153 in 04/2023  - Continue omega 3 fatty acids  - Managed by PCP    Dispo: Follow up in 3 months with Dr. [place]   Signed, Rollo FABIENE Louder, PA-C

## 2023-12-16 ENCOUNTER — Encounter: Payer: Self-pay | Admitting: Cardiology

## 2023-12-16 ENCOUNTER — Ambulatory Visit

## 2023-12-16 ENCOUNTER — Ambulatory Visit: Attending: Cardiology | Admitting: Cardiology

## 2023-12-16 VITALS — BP 146/88 | HR 63 | Ht 75.0 in | Wt 228.6 lb

## 2023-12-16 DIAGNOSIS — R55 Syncope and collapse: Secondary | ICD-10-CM

## 2023-12-16 DIAGNOSIS — I10 Essential (primary) hypertension: Secondary | ICD-10-CM | POA: Diagnosis not present

## 2023-12-16 DIAGNOSIS — E782 Mixed hyperlipidemia: Secondary | ICD-10-CM

## 2023-12-16 NOTE — Progress Notes (Unsigned)
 Enrolled patient for a 14 day Zio XT monitor to be mailed to patients home  [place] to read

## 2023-12-16 NOTE — Patient Instructions (Signed)
 Medication Instructions:  No changes *If you need a refill on your cardiac medications before your next appointment, please call your pharmacy*  Lab Work: Today we are going to draw a Bmet If you have labs (blood work) drawn today and your tests are completely normal, you will receive your results only by: MyChart Message (if you have MyChart) OR A paper copy in the mail If you have any lab test that is abnormal or we need to change your treatment, we will call you to review the results.  Testing/Procedures: Your physician has requested that you have an echocardiogram. Echocardiography is a painless test that uses sound waves to create images of your heart. It provides your doctor with information about the size and shape of your heart and how well your heart's chambers and valves are working. This procedure takes approximately one hour. There are no restrictions for this procedure. Please do NOT wear cologne, perfume, aftershave, or lotions (deodorant is allowed). Please arrive 15 minutes prior to your appointment time.  Please note: We ask at that you not bring children with you during ultrasound (echo/ vascular) testing. Due to room size and safety concerns, children are not allowed in the ultrasound rooms during exams. Our front office staff cannot provide observation of children in our lobby area while testing is being conducted. An adult accompanying a patient to their appointment will only be allowed in the ultrasound room at the discretion of the ultrasound technician under special circumstances. We apologize for any inconvenience.  Follow-Up: At [facility], you and your health needs are our priority.  As part of our continuing mission to provide you with exceptional heart care, our providers are all part of one team.  This team includes your primary Cardiologist (physician) and Advanced Practice Providers or APPs (Physician Assistants and Nurse Practitioners) who all work  together to provide you with the care you need, when you need it.  Your next appointment:   3 month(s)  Provider:   Newman JINNY Lawrence, MD    We recommend signing up for the patient portal called MyChart.  Sign up information is provided on this After Visit Summary.  MyChart is used to connect with patients for Virtual Visits (Telemedicine).  Patients are able to view lab/test results, encounter notes, upcoming appointments, etc.  Non-urgent messages can be sent to your provider as well.   To learn more about what you can do with MyChart, go to [email or web address].   Other Instructions ZIO XT- Long Term Monitor Instructions  Your physician has requested you wear a ZIO patch monitor for 14 days.  This is a single patch monitor. [employer] supplies one patch monitor per enrollment. Additional stickers are not available. Please do not apply patch if you will be having a Nuclear Stress Test,  Echocardiogram, Cardiac CT, MRI, or Chest Xray during the period you would be wearing the  monitor. The patch cannot be worn during these tests. You cannot remove and re-apply the  ZIO XT patch monitor.  Your ZIO patch monitor will be mailed 3 day USPS to your address on file. It may take 3-5 days  to receive your monitor after you have been enrolled.  Once you have received your monitor, please review the enclosed instructions. Your monitor  has already been registered assigning a specific monitor serial # to you.  Billing and Patient Assistance Program Information  We have supplied [employer] with any of your insurance information on file for billing purposes. [employer] offers a  sliding scale Patient Assistance Program for patients that do not have  insurance, or whose insurance does not completely cover the cost of the ZIO monitor.  You must apply for the Patient Assistance Program to qualify for this discounted rate.  To apply, please call [employer] at [phone], select option 4, select option 2,  ask to apply for  Patient Assistance Program. Meredeth will ask your household income, and how many people  are in your household. They will quote your out-of-pocket cost based on that information.  [employer] will also be able to set up a 70-month, interest-free payment plan if needed.  Applying the monitor   Shave hair from upper left chest.  Hold abrader disc by orange tab. Rub abrader in 40 strokes over the upper left chest as  indicated in your monitor instructions.  Clean area with 4 enclosed alcohol pads. Let dry.  Apply patch as indicated in monitor instructions. Patch will be placed under collarbone on left  side of chest with arrow pointing upward.  Rub patch adhesive wings for 2 minutes. Remove white label marked 1. Remove the white  label marked 2. Rub patch adhesive wings for 2 additional minutes.  While looking in a mirror, press and release button in center of patch. A small green light will  flash 3-4 times. This will be your only indicator that the monitor has been turned on.  Do not shower for the first 24 hours. You may shower after the first 24 hours.  Press the button if you feel a symptom. You will hear a small click. Record Date, Time and  Symptom in the Patient Logbook.  When you are ready to remove the patch, follow instructions on the last 2 pages of Patient  Logbook. Stick patch monitor onto the last page of Patient Logbook.  Place Patient Logbook in the blue and white box. Use locking tab on box and tape box closed  securely. The blue and white box has prepaid postage on it. Please place it in the mailbox as  soon as possible. Your physician should have your test results approximately 7 days after the  monitor has been mailed back to [facility].  Call [facility] Customer Care at [phone] if you have questions regarding  your ZIO XT patch monitor. Call them immediately if you see an orange light blinking on your  monitor.  If your monitor falls off  in less than 4 days, contact our Monitor department at [phone].  If your monitor becomes loose or falls off after 4 days call [employer] at [phone] for  suggestions on securing your monitor

## 2023-12-17 ENCOUNTER — Ambulatory Visit: Payer: Self-pay | Admitting: Cardiology

## 2023-12-17 LAB — BASIC METABOLIC PANEL WITH GFR
BUN/Creatinine Ratio: 19 (ref 10–24)
BUN: 19 mg/dL (ref 8–27)
CO2: 24 mmol/L (ref 20–29)
Calcium: 9.4 mg/dL (ref 8.6–10.2)
Chloride: 104 mmol/L (ref 96–106)
Creatinine, Ser: 1.01 mg/dL (ref 0.76–1.27)
Glucose: 77 mg/dL (ref 70–99)
Potassium: 4.3 mmol/L (ref 3.5–5.2)
Sodium: 142 mmol/L (ref 134–144)
eGFR: 81 mL/min/1.73 (ref >59)

## 2023-12-22 DIAGNOSIS — R6884 Jaw pain: Secondary | ICD-10-CM | POA: Diagnosis not present

## 2023-12-22 DIAGNOSIS — Z111 Encounter for screening for respiratory tuberculosis: Secondary | ICD-10-CM | POA: Diagnosis not present

## 2023-12-26 NOTE — Telephone Encounter (Signed)
 Called patient advised of below they verbalized understanding.

## 2023-12-26 NOTE — Telephone Encounter (Signed)
-----   Message from Rollo FABIENE Louder sent at 12/17/2023  7:55 AM EDT ----- Please tell patient that his labwork from yesterday was normal- his creatinine had been up to 1.41 when he was in the ED last month, but has returned to normal at 1.01. Normal kidney function and  electrolytes   No changes to treatment plan   Thanks KJ  ----- Message ----- From: Rebecka Memos Lab Results In Sent: 12/17/2023  12:35 AM EDT To: Rollo JONELLE Louder, PA-C

## 2024-01-08 DIAGNOSIS — M542 Cervicalgia: Secondary | ICD-10-CM | POA: Diagnosis not present

## 2024-01-08 DIAGNOSIS — M26629 Arthralgia of temporomandibular joint, unspecified side: Secondary | ICD-10-CM | POA: Diagnosis not present

## 2024-01-15 ENCOUNTER — Ambulatory Visit (HOSPITAL_COMMUNITY)
Admission: RE | Admit: 2024-01-15 | Discharge: 2024-01-15 | Disposition: A | Discharge status: Home / Self Care | Source: Ambulatory Visit | Attending: Cardiology | Admitting: Cardiology

## 2024-01-15 DIAGNOSIS — R55 Syncope and collapse: Secondary | ICD-10-CM | POA: Diagnosis not present

## 2024-01-15 LAB — ECHOCARDIOGRAM COMPLETE
Area-P 1/2: 2.57 cm2
S' Lateral: 3.2 cm

## 2024-02-03 ENCOUNTER — Ambulatory Visit: Admitting: Internal Medicine

## 2024-03-18 ENCOUNTER — Ambulatory Visit: Admitting: Cardiology
